# Patient Record
Sex: Female | Born: 1935 | Race: Black or African American | Hispanic: No | Smoking: Never smoker
Health system: Southern US, Community
[De-identification: ages and names within clinical notes are randomized; demographics above are authoritative.]

## PROBLEM LIST (undated history)

## (undated) DIAGNOSIS — R609 Edema, unspecified: Secondary | ICD-10-CM

## (undated) DIAGNOSIS — B029 Zoster without complications: Secondary | ICD-10-CM

## (undated) DIAGNOSIS — N189 Chronic kidney disease, unspecified: Secondary | ICD-10-CM

## (undated) DIAGNOSIS — C50912 Malignant neoplasm of unspecified site of left female breast: Secondary | ICD-10-CM

## (undated) DIAGNOSIS — R1310 Dysphagia, unspecified: Secondary | ICD-10-CM

## (undated) DIAGNOSIS — K2950 Unspecified chronic gastritis without bleeding: Secondary | ICD-10-CM

## (undated) DIAGNOSIS — R109 Unspecified abdominal pain: Secondary | ICD-10-CM

## (undated) DIAGNOSIS — J449 Chronic obstructive pulmonary disease, unspecified: Secondary | ICD-10-CM

## (undated) DIAGNOSIS — R198 Other specified symptoms and signs involving the digestive system and abdomen: Secondary | ICD-10-CM

## (undated) DIAGNOSIS — C50919 Malignant neoplasm of unspecified site of unspecified female breast: Secondary | ICD-10-CM

## (undated) DIAGNOSIS — N3281 Overactive bladder: Secondary | ICD-10-CM

## (undated) DIAGNOSIS — I10 Essential (primary) hypertension: Secondary | ICD-10-CM

## (undated) HISTORY — PX: VAGINAL HYSTERECTOMY: SUR661

## (undated) HISTORY — DX: Malignant neoplasm of unspecified site of left female breast: C50.912

## (undated) HISTORY — DX: Overactive bladder: N32.81

## (undated) HISTORY — DX: Other specified symptoms and signs involving the digestive system and abdomen: R19.8

## (undated) HISTORY — DX: Chronic kidney disease, unspecified: N18.9

## (undated) HISTORY — DX: Dysphagia, unspecified: R13.10

## (undated) HISTORY — DX: Unspecified abdominal pain: R10.9

## (undated) HISTORY — DX: Edema, unspecified: R60.9

## (undated) HISTORY — DX: Essential (primary) hypertension: I10

## (undated) HISTORY — DX: Unspecified chronic gastritis without bleeding: K29.50

## (undated) HISTORY — DX: Zoster without complications: B02.9

---

## 1998-02-22 ENCOUNTER — Ambulatory Visit (HOSPITAL_COMMUNITY): Admission: RE | Admit: 1998-02-22 | Discharge: 1998-02-22 | Payer: Self-pay | Admitting: *Deleted

## 1998-06-30 ENCOUNTER — Ambulatory Visit (HOSPITAL_COMMUNITY): Admission: RE | Admit: 1998-06-30 | Discharge: 1998-06-30 | Payer: Self-pay | Admitting: General Surgery

## 1998-07-13 ENCOUNTER — Ambulatory Visit (HOSPITAL_COMMUNITY): Admission: RE | Admit: 1998-07-13 | Discharge: 1998-07-13 | Payer: Self-pay | Admitting: General Surgery

## 1998-08-15 ENCOUNTER — Encounter: Admission: RE | Admit: 1998-08-15 | Discharge: 1998-11-13 | Payer: Self-pay | Admitting: Radiation Oncology

## 1999-01-03 ENCOUNTER — Encounter: Payer: Self-pay | Admitting: *Deleted

## 1999-01-03 ENCOUNTER — Ambulatory Visit (HOSPITAL_COMMUNITY): Admission: RE | Admit: 1999-01-03 | Discharge: 1999-01-03 | Payer: Self-pay | Admitting: *Deleted

## 1999-01-16 ENCOUNTER — Encounter: Payer: Self-pay | Admitting: Hematology and Oncology

## 1999-01-16 ENCOUNTER — Ambulatory Visit (HOSPITAL_COMMUNITY): Admission: RE | Admit: 1999-01-16 | Discharge: 1999-01-16 | Payer: Self-pay | Admitting: Hematology and Oncology

## 1999-02-06 ENCOUNTER — Ambulatory Visit (HOSPITAL_COMMUNITY): Admission: RE | Admit: 1999-02-06 | Discharge: 1999-02-06 | Payer: Self-pay | Admitting: General Surgery

## 1999-02-06 ENCOUNTER — Encounter (HOSPITAL_BASED_OUTPATIENT_CLINIC_OR_DEPARTMENT_OTHER): Payer: Self-pay | Admitting: General Surgery

## 1999-02-14 ENCOUNTER — Ambulatory Visit (HOSPITAL_COMMUNITY): Admission: RE | Admit: 1999-02-14 | Discharge: 1999-02-14 | Payer: Self-pay | Admitting: General Surgery

## 1999-02-14 ENCOUNTER — Encounter (HOSPITAL_BASED_OUTPATIENT_CLINIC_OR_DEPARTMENT_OTHER): Payer: Self-pay | Admitting: General Surgery

## 1999-08-14 ENCOUNTER — Ambulatory Visit (HOSPITAL_COMMUNITY): Admission: RE | Admit: 1999-08-14 | Discharge: 1999-08-14 | Payer: Self-pay | Admitting: Hematology and Oncology

## 1999-08-14 ENCOUNTER — Encounter: Payer: Self-pay | Admitting: Hematology and Oncology

## 1999-09-28 ENCOUNTER — Inpatient Hospital Stay (HOSPITAL_COMMUNITY): Admission: EM | Admit: 1999-09-28 | Discharge: 1999-10-05 | Payer: Self-pay | Admitting: Critical Care Medicine

## 1999-09-29 ENCOUNTER — Encounter: Payer: Self-pay | Admitting: Critical Care Medicine

## 1999-10-02 ENCOUNTER — Encounter: Payer: Self-pay | Admitting: Critical Care Medicine

## 2000-02-14 ENCOUNTER — Ambulatory Visit (HOSPITAL_COMMUNITY): Admission: RE | Admit: 2000-02-14 | Discharge: 2000-02-14 | Payer: Self-pay | Admitting: *Deleted

## 2000-02-14 ENCOUNTER — Encounter: Payer: Self-pay | Admitting: *Deleted

## 2000-03-26 ENCOUNTER — Encounter: Payer: Self-pay | Admitting: Emergency Medicine

## 2000-03-27 ENCOUNTER — Inpatient Hospital Stay (HOSPITAL_COMMUNITY): Admission: EM | Admit: 2000-03-27 | Discharge: 2000-04-01 | Payer: Self-pay | Admitting: Emergency Medicine

## 2000-08-19 ENCOUNTER — Encounter: Payer: Self-pay | Admitting: *Deleted

## 2000-08-19 ENCOUNTER — Encounter: Admission: RE | Admit: 2000-08-19 | Discharge: 2000-08-19 | Payer: Self-pay | Admitting: *Deleted

## 2001-06-26 ENCOUNTER — Encounter: Payer: Self-pay | Admitting: *Deleted

## 2001-06-26 ENCOUNTER — Ambulatory Visit (HOSPITAL_COMMUNITY): Admission: RE | Admit: 2001-06-26 | Discharge: 2001-06-26 | Payer: Self-pay | Admitting: *Deleted

## 2001-08-07 ENCOUNTER — Inpatient Hospital Stay (HOSPITAL_COMMUNITY): Admission: EM | Admit: 2001-08-07 | Discharge: 2001-08-10 | Payer: Self-pay | Admitting: Internal Medicine

## 2001-08-13 ENCOUNTER — Ambulatory Visit (HOSPITAL_COMMUNITY): Admission: RE | Admit: 2001-08-13 | Discharge: 2001-08-13 | Payer: Self-pay | Admitting: *Deleted

## 2001-09-02 ENCOUNTER — Encounter (HOSPITAL_COMMUNITY): Payer: Self-pay | Admitting: Oncology

## 2001-09-02 ENCOUNTER — Encounter: Payer: Self-pay | Admitting: Internal Medicine

## 2001-09-02 ENCOUNTER — Ambulatory Visit (HOSPITAL_COMMUNITY): Admission: RE | Admit: 2001-09-02 | Discharge: 2001-09-02 | Payer: Self-pay | Admitting: Oncology

## 2001-09-02 ENCOUNTER — Encounter: Admission: RE | Admit: 2001-09-02 | Discharge: 2001-09-02 | Payer: Self-pay | Admitting: Family Medicine

## 2002-09-06 ENCOUNTER — Encounter: Admission: RE | Admit: 2002-09-06 | Discharge: 2002-09-06 | Payer: Self-pay | Admitting: Internal Medicine

## 2002-09-06 ENCOUNTER — Encounter: Payer: Self-pay | Admitting: Internal Medicine

## 2002-09-28 ENCOUNTER — Ambulatory Visit (HOSPITAL_COMMUNITY): Admission: RE | Admit: 2002-09-28 | Discharge: 2002-09-28 | Payer: Self-pay | Admitting: Oncology

## 2002-09-28 ENCOUNTER — Encounter (HOSPITAL_COMMUNITY): Payer: Self-pay | Admitting: Oncology

## 2002-11-07 ENCOUNTER — Emergency Department (HOSPITAL_COMMUNITY): Admission: EM | Admit: 2002-11-07 | Discharge: 2002-11-08 | Payer: Self-pay | Admitting: Emergency Medicine

## 2003-09-08 ENCOUNTER — Encounter: Admission: RE | Admit: 2003-09-08 | Discharge: 2003-09-08 | Payer: Self-pay | Admitting: Internal Medicine

## 2003-09-08 ENCOUNTER — Encounter: Payer: Self-pay | Admitting: Internal Medicine

## 2003-09-16 ENCOUNTER — Ambulatory Visit: Admission: RE | Admit: 2003-09-16 | Discharge: 2003-09-16 | Payer: Self-pay | Admitting: *Deleted

## 2003-09-16 ENCOUNTER — Ambulatory Visit (HOSPITAL_COMMUNITY): Admission: RE | Admit: 2003-09-16 | Discharge: 2003-09-16 | Payer: Self-pay | Admitting: *Deleted

## 2003-09-16 ENCOUNTER — Encounter: Payer: Self-pay | Admitting: Internal Medicine

## 2003-09-30 ENCOUNTER — Encounter (HOSPITAL_COMMUNITY): Payer: Self-pay | Admitting: Oncology

## 2003-09-30 ENCOUNTER — Ambulatory Visit (HOSPITAL_COMMUNITY): Admission: RE | Admit: 2003-09-30 | Discharge: 2003-09-30 | Payer: Self-pay | Admitting: Oncology

## 2004-09-11 ENCOUNTER — Ambulatory Visit (HOSPITAL_COMMUNITY): Admission: RE | Admit: 2004-09-11 | Discharge: 2004-09-11 | Payer: Self-pay | Admitting: Gastroenterology

## 2004-09-13 ENCOUNTER — Encounter: Admission: RE | Admit: 2004-09-13 | Discharge: 2004-09-13 | Payer: Self-pay | Admitting: General Surgery

## 2004-09-20 ENCOUNTER — Inpatient Hospital Stay (HOSPITAL_COMMUNITY): Admission: AD | Admit: 2004-09-20 | Discharge: 2004-09-21 | Payer: Self-pay | Admitting: Internal Medicine

## 2004-11-06 ENCOUNTER — Ambulatory Visit: Payer: Self-pay | Admitting: Oncology

## 2004-11-13 ENCOUNTER — Ambulatory Visit: Payer: Self-pay | Admitting: Internal Medicine

## 2005-01-08 ENCOUNTER — Ambulatory Visit: Payer: Self-pay | Admitting: Internal Medicine

## 2005-04-19 ENCOUNTER — Ambulatory Visit: Payer: Self-pay | Admitting: Internal Medicine

## 2005-09-16 ENCOUNTER — Encounter: Admission: RE | Admit: 2005-09-16 | Discharge: 2005-09-16 | Payer: Self-pay | Admitting: Internal Medicine

## 2005-10-03 ENCOUNTER — Ambulatory Visit: Payer: Self-pay | Admitting: Oncology

## 2005-11-04 ENCOUNTER — Ambulatory Visit (HOSPITAL_COMMUNITY): Admission: RE | Admit: 2005-11-04 | Discharge: 2005-11-04 | Payer: Self-pay | Admitting: Oncology

## 2006-08-22 ENCOUNTER — Encounter: Admission: RE | Admit: 2006-08-22 | Discharge: 2006-08-22 | Payer: Self-pay | Admitting: Oncology

## 2006-10-29 ENCOUNTER — Ambulatory Visit: Payer: Self-pay | Admitting: Oncology

## 2006-11-03 LAB — CBC WITH DIFFERENTIAL/PLATELET
BASO%: 0.6 % (ref 0.0–2.0)
EOS%: 6.6 % (ref 0.0–7.0)
HGB: 12.3 g/dL (ref 11.6–15.9)
MCH: 31.6 pg (ref 26.0–34.0)
MCHC: 34.1 g/dL (ref 32.0–36.0)
RDW: 12.3 % (ref 11.3–14.5)
lymph#: 1.7 10*3/uL (ref 0.9–3.3)

## 2006-11-03 LAB — LACTATE DEHYDROGENASE: LDH: 158 U/L (ref 94–250)

## 2006-11-03 LAB — COMPREHENSIVE METABOLIC PANEL
AST: 22 U/L (ref 0–37)
Albumin: 3.8 g/dL (ref 3.5–5.2)
Alkaline Phosphatase: 70 U/L (ref 39–117)
Potassium: 3.9 mEq/L (ref 3.5–5.3)
Sodium: 140 mEq/L (ref 135–145)
Total Protein: 6.8 g/dL (ref 6.0–8.3)

## 2006-12-03 ENCOUNTER — Emergency Department (HOSPITAL_COMMUNITY): Admission: EM | Admit: 2006-12-03 | Discharge: 2006-12-03 | Payer: Self-pay | Admitting: Emergency Medicine

## 2006-12-03 ENCOUNTER — Encounter: Payer: Self-pay | Admitting: Vascular Surgery

## 2007-01-31 ENCOUNTER — Inpatient Hospital Stay (HOSPITAL_COMMUNITY): Admission: EM | Admit: 2007-01-31 | Discharge: 2007-02-01 | Payer: Self-pay | Admitting: Emergency Medicine

## 2007-08-07 ENCOUNTER — Ambulatory Visit: Payer: Self-pay | Admitting: Internal Medicine

## 2007-08-07 LAB — CONVERTED CEMR LAB
BUN: 17 mg/dL (ref 6–23)
Basophils Absolute: 0 10*3/uL (ref 0.0–0.1)
Basophils Relative: 0.4 % (ref 0.0–1.0)
CO2: 27 meq/L (ref 19–32)
Calcium: 9.6 mg/dL (ref 8.4–10.5)
Chloride: 102 meq/L (ref 96–112)
Creatinine, Ser: 1.5 mg/dL — ABNORMAL HIGH (ref 0.4–1.2)
Eosinophils Absolute: 0.7 10*3/uL — ABNORMAL HIGH (ref 0.0–0.6)
Eosinophils Relative: 10.1 % — ABNORMAL HIGH (ref 0.0–5.0)
GFR calc Af Amer: 42 mL/min
GFR calc non Af Amer: 35 mL/min
Glucose, Bld: 89 mg/dL (ref 70–99)
HCT: 32.8 % — ABNORMAL LOW (ref 36.0–46.0)
Hemoglobin: 11.3 g/dL — ABNORMAL LOW (ref 12.0–15.0)
Lymphocytes Relative: 28.3 % (ref 12.0–46.0)
MCHC: 34.5 g/dL (ref 30.0–36.0)
MCV: 92.8 fL (ref 78.0–100.0)
Monocytes Absolute: 0.5 10*3/uL (ref 0.2–0.7)
Monocytes Relative: 7.9 % (ref 3.0–11.0)
Neutro Abs: 3.5 10*3/uL (ref 1.4–7.7)
Neutrophils Relative %: 53.3 % (ref 43.0–77.0)
Platelets: 289 10*3/uL (ref 150–400)
Potassium: 3.4 meq/L — ABNORMAL LOW (ref 3.5–5.1)
Pro B Natriuretic peptide (BNP): 32 pg/mL (ref 0.0–100.0)
RBC: 3.54 M/uL — ABNORMAL LOW (ref 3.87–5.11)
RDW: 11.4 % — ABNORMAL LOW (ref 11.5–14.6)
Sodium: 139 meq/L (ref 135–145)
WBC: 6.5 10*3/uL (ref 4.5–10.5)

## 2007-08-14 ENCOUNTER — Ambulatory Visit: Payer: Self-pay | Admitting: Internal Medicine

## 2007-08-25 ENCOUNTER — Encounter: Admission: RE | Admit: 2007-08-25 | Discharge: 2007-08-25 | Payer: Self-pay | Admitting: Oncology

## 2007-10-28 ENCOUNTER — Ambulatory Visit: Payer: Self-pay | Admitting: Internal Medicine

## 2007-10-28 ENCOUNTER — Ambulatory Visit: Payer: Self-pay | Admitting: Oncology

## 2007-11-02 LAB — CBC WITH DIFFERENTIAL/PLATELET
Basophils Absolute: 0 10*3/uL (ref 0.0–0.1)
EOS%: 3.5 % (ref 0.0–7.0)
LYMPH%: 26.8 % (ref 14.0–48.0)
MCH: 32.2 pg (ref 26.0–34.0)
MCV: 92.2 fL (ref 81.0–101.0)
MONO%: 7.6 % (ref 0.0–13.0)
RBC: 3.68 10*6/uL — ABNORMAL LOW (ref 3.70–5.32)
RDW: 12.5 % (ref 11.3–14.5)

## 2007-11-02 LAB — COMPREHENSIVE METABOLIC PANEL
AST: 23 U/L (ref 0–37)
Albumin: 3.9 g/dL (ref 3.5–5.2)
Alkaline Phosphatase: 54 U/L (ref 39–117)
BUN: 21 mg/dL (ref 6–23)
Potassium: 3.8 mEq/L (ref 3.5–5.3)
Sodium: 143 mEq/L (ref 135–145)
Total Bilirubin: 0.6 mg/dL (ref 0.3–1.2)

## 2008-07-01 ENCOUNTER — Ambulatory Visit: Payer: Self-pay | Admitting: Internal Medicine

## 2008-07-01 DIAGNOSIS — C50919 Malignant neoplasm of unspecified site of unspecified female breast: Secondary | ICD-10-CM | POA: Insufficient documentation

## 2008-07-01 DIAGNOSIS — J45909 Unspecified asthma, uncomplicated: Secondary | ICD-10-CM | POA: Insufficient documentation

## 2008-07-01 DIAGNOSIS — I10 Essential (primary) hypertension: Secondary | ICD-10-CM | POA: Insufficient documentation

## 2008-09-08 ENCOUNTER — Encounter: Admission: RE | Admit: 2008-09-08 | Discharge: 2008-09-08 | Payer: Self-pay | Admitting: Internal Medicine

## 2008-10-28 ENCOUNTER — Ambulatory Visit: Payer: Self-pay | Admitting: Oncology

## 2008-11-01 LAB — CBC WITH DIFFERENTIAL/PLATELET
BASO%: 0.4 % (ref 0.0–2.0)
Basophils Absolute: 0 10*3/uL (ref 0.0–0.1)
HCT: 36 % (ref 34.8–46.6)
HGB: 12.4 g/dL (ref 11.6–15.9)
MCHC: 34.5 g/dL (ref 32.0–36.0)
MONO#: 0.7 10*3/uL (ref 0.1–0.9)
NEUT#: 8.3 10*3/uL — ABNORMAL HIGH (ref 1.5–6.5)
NEUT%: 76.4 % (ref 39.6–76.8)
WBC: 10.9 10*3/uL — ABNORMAL HIGH (ref 3.9–10.0)
lymph#: 1.8 10*3/uL (ref 0.9–3.3)

## 2008-11-01 LAB — COMPREHENSIVE METABOLIC PANEL
ALT: 19 U/L (ref 0–35)
CO2: 27 mEq/L (ref 19–32)
Calcium: 9.1 mg/dL (ref 8.4–10.5)
Chloride: 104 mEq/L (ref 96–112)
Creatinine, Ser: 1.39 mg/dL — ABNORMAL HIGH (ref 0.40–1.20)

## 2008-11-01 LAB — LACTATE DEHYDROGENASE: LDH: 180 U/L (ref 94–250)

## 2009-10-10 ENCOUNTER — Encounter: Admission: RE | Admit: 2009-10-10 | Discharge: 2009-10-10 | Payer: Self-pay | Admitting: Internal Medicine

## 2009-10-17 ENCOUNTER — Encounter: Admission: RE | Admit: 2009-10-17 | Discharge: 2009-10-17 | Payer: Self-pay | Admitting: Internal Medicine

## 2009-10-27 ENCOUNTER — Ambulatory Visit: Payer: Self-pay | Admitting: Oncology

## 2009-10-31 LAB — CBC WITH DIFFERENTIAL/PLATELET
Eosinophils Absolute: 0.4 10*3/uL (ref 0.0–0.5)
HCT: 33.9 % — ABNORMAL LOW (ref 34.8–46.6)
LYMPH%: 22.2 % (ref 14.0–49.7)
MCV: 94.2 fL (ref 79.5–101.0)
MONO#: 0.5 10*3/uL (ref 0.1–0.9)
NEUT#: 4.6 10*3/uL (ref 1.5–6.5)
NEUT%: 64 % (ref 38.4–76.8)
Platelets: 301 10*3/uL (ref 145–400)
WBC: 7.1 10*3/uL (ref 3.9–10.3)

## 2009-10-31 LAB — COMPREHENSIVE METABOLIC PANEL
BUN: 17 mg/dL (ref 6–23)
CO2: 24 mEq/L (ref 19–32)
Creatinine, Ser: 1.51 mg/dL — ABNORMAL HIGH (ref 0.40–1.20)
Glucose, Bld: 106 mg/dL — ABNORMAL HIGH (ref 70–99)
Total Bilirubin: 0.5 mg/dL (ref 0.3–1.2)

## 2009-10-31 LAB — LACTATE DEHYDROGENASE: LDH: 145 U/L (ref 94–250)

## 2010-02-06 ENCOUNTER — Ambulatory Visit: Payer: Self-pay | Admitting: Internal Medicine

## 2010-02-06 DIAGNOSIS — R0989 Other specified symptoms and signs involving the circulatory and respiratory systems: Secondary | ICD-10-CM | POA: Insufficient documentation

## 2010-02-06 DIAGNOSIS — R0609 Other forms of dyspnea: Secondary | ICD-10-CM

## 2010-02-06 LAB — CONVERTED CEMR LAB
ALT: 21 units/L (ref 0–35)
AST: 32 units/L (ref 0–37)
Albumin: 3.9 g/dL (ref 3.5–5.2)
Alkaline Phosphatase: 66 units/L (ref 39–117)
BUN: 23 mg/dL (ref 6–23)
Basophils Absolute: 0 10*3/uL (ref 0.0–0.1)
Basophils Relative: 0.4 % (ref 0.0–3.0)
Bilirubin, Direct: 0.1 mg/dL (ref 0.0–0.3)
CO2: 33 meq/L — ABNORMAL HIGH (ref 19–32)
Calcium: 9.8 mg/dL (ref 8.4–10.5)
Chloride: 103 meq/L (ref 96–112)
Creatinine, Ser: 1.5 mg/dL — ABNORMAL HIGH (ref 0.4–1.2)
Eosinophils Absolute: 0.2 10*3/uL (ref 0.0–0.7)
Eosinophils Relative: 2.8 % (ref 0.0–5.0)
GFR calc non Af Amer: 41.96 mL/min (ref >60)
Glucose, Bld: 96 mg/dL (ref 70–99)
HCT: 35.4 % — ABNORMAL LOW (ref 36.0–46.0)
Hemoglobin: 11.7 g/dL — ABNORMAL LOW (ref 12.0–15.0)
Lymphocytes Relative: 17.3 % (ref 12.0–46.0)
Lymphs Abs: 1.2 10*3/uL (ref 0.7–4.0)
MCHC: 33.2 g/dL (ref 30.0–36.0)
MCV: 96 fL (ref 78.0–100.0)
Monocytes Absolute: 0.2 10*3/uL (ref 0.1–1.0)
Monocytes Relative: 3.3 % (ref 3.0–12.0)
Neutro Abs: 5.4 10*3/uL (ref 1.4–7.7)
Neutrophils Relative %: 76.2 % (ref 43.0–77.0)
Platelets: 292 10*3/uL (ref 150.0–400.0)
Potassium: 3.8 meq/L (ref 3.5–5.1)
Pro B Natriuretic peptide (BNP): 50 pg/mL (ref 0.0–100.0)
RBC: 3.68 M/uL — ABNORMAL LOW (ref 3.87–5.11)
RDW: 12.5 % (ref 11.5–14.6)
Sodium: 145 meq/L (ref 135–145)
TSH: 4.76 microintl units/mL (ref 0.35–5.50)
Total Bilirubin: 0.6 mg/dL (ref 0.3–1.2)
Total Protein: 7.7 g/dL (ref 6.0–8.3)
WBC: 7 10*3/uL (ref 4.5–10.5)

## 2010-02-15 ENCOUNTER — Ambulatory Visit: Payer: Self-pay | Admitting: Internal Medicine

## 2010-04-16 ENCOUNTER — Ambulatory Visit: Payer: Self-pay | Admitting: Internal Medicine

## 2010-11-15 ENCOUNTER — Ambulatory Visit: Payer: Self-pay | Admitting: Oncology

## 2010-11-19 ENCOUNTER — Encounter: Admission: RE | Admit: 2010-11-19 | Discharge: 2010-11-19 | Payer: Self-pay | Admitting: Internal Medicine

## 2010-11-21 ENCOUNTER — Encounter: Payer: Self-pay | Admitting: Internal Medicine

## 2010-11-21 LAB — CBC WITH DIFFERENTIAL/PLATELET
BASO%: 0.5 % (ref 0.0–2.0)
Basophils Absolute: 0 10*3/uL (ref 0.0–0.1)
EOS%: 8.5 % — ABNORMAL HIGH (ref 0.0–7.0)
Eosinophils Absolute: 0.6 10*3/uL — ABNORMAL HIGH (ref 0.0–0.5)
HCT: 32.1 % — ABNORMAL LOW (ref 34.8–46.6)
HGB: 11 g/dL — ABNORMAL LOW (ref 11.6–15.9)
LYMPH%: 37.1 % (ref 14.0–49.7)
MCH: 32 pg (ref 25.1–34.0)
MCHC: 34.3 g/dL (ref 31.5–36.0)
MCV: 93.6 fL (ref 79.5–101.0)
MONO#: 0.6 10*3/uL (ref 0.1–0.9)
MONO%: 8.6 % (ref 0.0–14.0)
NEUT#: 3.2 10*3/uL (ref 1.5–6.5)
NEUT%: 45.3 % (ref 38.4–76.8)
Platelets: 299 10*3/uL (ref 145–400)
RBC: 3.43 10*6/uL — ABNORMAL LOW (ref 3.70–5.45)
RDW: 12.9 % (ref 11.2–14.5)
WBC: 7.1 10*3/uL (ref 3.9–10.3)
lymph#: 2.6 10*3/uL (ref 0.9–3.3)

## 2010-11-22 LAB — COMPREHENSIVE METABOLIC PANEL
ALT: 9 U/L (ref 0–35)
AST: 19 U/L (ref 0–37)
Albumin: 4 g/dL (ref 3.5–5.2)
Alkaline Phosphatase: 70 U/L (ref 39–117)
BUN: 26 mg/dL — ABNORMAL HIGH (ref 6–23)
CO2: 27 mEq/L (ref 19–32)
Calcium: 9.5 mg/dL (ref 8.4–10.5)
Chloride: 107 mEq/L (ref 96–112)
Creatinine, Ser: 1.51 mg/dL — ABNORMAL HIGH (ref 0.40–1.20)
Glucose, Bld: 87 mg/dL (ref 70–99)
Potassium: 3.4 mEq/L — ABNORMAL LOW (ref 3.5–5.3)
Sodium: 144 mEq/L (ref 135–145)
Total Bilirubin: 0.5 mg/dL (ref 0.3–1.2)
Total Protein: 6.9 g/dL (ref 6.0–8.3)

## 2010-11-22 LAB — PREALBUMIN: Prealbumin: 19.4 mg/dL (ref 18.0–45.0)

## 2010-11-22 LAB — LACTATE DEHYDROGENASE: LDH: 163 U/L (ref 94–250)

## 2011-01-05 ENCOUNTER — Encounter (HOSPITAL_COMMUNITY): Payer: Self-pay | Admitting: Oncology

## 2011-01-06 ENCOUNTER — Encounter: Payer: Self-pay | Admitting: Internal Medicine

## 2011-01-17 NOTE — Letter (Signed)
Summary: [facility]  [facility]   Imported By: Lester Comfrey 11/28/2010 08:19:01  _____________________________________________________________________  External Attachment:    Type:   Image     Comment:   External Document

## 2011-01-17 NOTE — Assessment & Plan Note (Signed)
Summary: Pulmonary/ eval sob and teach hfa 50%   Primary Provider/Referring Provider:  Kirby Funk  CC:  Acute visit.  Pt c/o increased SOB that "comes and goes".  No other complaints today.Marland Kitchen  History of Present Illness: 33  yobf with chronic asthma prednisone dependent with floor of 5 mg every other day   February 06, 2010 Acute visit.  Pt c/o increased SOB that "comes and goes".  No other complaints of cough,  no noct or early am exac.  Tried ventolin > makes throat too dry.  Pt denies any significant sore throat, dysphagia, itching, sneezing,  nasal congestion or excess secretions,  fever, chills, sweats, unintended wt loss, pleuritic or exertional cp, hempoptysis, change in activity tolerance  orthopnea pnd or leg swelling.  Pt also denies any obvious fluctuation in symptoms with weather or environmental change or other alleviating or aggravating factors.       Current Medications (verified): 1)  Omeprazole 20 Mg  Cpdr (Omeprazole) .... Once Daily 2)  Oxybutynin Chloride 5 Mg  Xr24h-Tab (Oxybutynin Chloride) .... 1/2 Three Times A Day 3)  Triamterene-Hctz 37.5-25 Mg  Tabs (Triamterene-Hctz) .... Once Daily 4)  Klor-Con 20 Meq  Pack (Potassium Chloride) .... Two Times A Day 5)  Advair Diskus 100-50 Mcg/dose  Misc (Fluticasone-Salmeterol) .Marland Kitchen.. 1 Puff Two Times A Day 6)  Felodipine 5 Mg  Xr24h-Tab (Felodipine) .... Once Daily 7)  Ventolin Hfa 108 (90 Base) Mcg/act  Aers (Albuterol Sulfate) .... 2 Puffs Every 4 Hours As Needed 8)  Megace Oral 40 Mg/ml  Susp (Megestrol Acetate) .... As Needed 9)  Prednisone 10 Mg  Tabs (Prednisone) .... 1/2 Every Other Day  Allergies (verified): No Known Drug Allergies  Past History:  Past Medical History: Asthma    - HFA  50 % February 06, 2010  Hypertension  Vital Signs:  Patient profile:   77 year old female Weight:      123.50 pounds O2 Sat:      94 % on Room air Temp:     97.9 degrees F oral Pulse rate:   104 / minute BP sitting:   130  / 60  (left arm)  Vitals Entered By: Vernie Murders (February 06, 2010 10:22 AM)  O2 Flow:  Room air  Physical Exam  Additional Exam:  amb bf nad wt 128 > 123 February 06, 2010  HEENT mild turbinate edema.  Oropharynx no thrush or excess pnd or cobblestoning.  No JVD or cervical adenopathy. Mild accessory muscle hypertrophy. Trachea midline, nl thryroid. Chest was hyperinflated by percussion with diminished breath sounds and moderate increased exp time with trace bilater exp wheeze.  Hoover sign positive at mid inspiration. Regular rate and rhythm without murmur gallop or rub or increase P2.  No edema. Abd: no hsm, nl excursion. Ext warm without cyanosis or clubbing.    Sodium                    145 mEq/L                   135-145   Potassium                 3.8 mEq/L                   3.5-5.1   Chloride                  103 mEq/L  96-112   Carbon Dioxide       [H]  33 mEq/L                    19-32   Glucose                   96 mg/dL                    16-10   BUN                       23 mg/dL                    9-60   Creatinine           [H]  1.5 mg/dL                   4.5-4.0   Calcium                   9.8 mg/dL                   9.8-11.9   GFR                       41.96 mL/min                >60  Tests: (2) TSH (TSH)   FastTSH                   4.76 uIU/mL                 0.35-5.50  Tests: (3) CBC Platelet w/Diff (CBCD)   White Cell Count          7.0 K/uL                    4.5-10.5   Red Cell Count       [L]  3.68 Mil/uL                 3.87-5.11   Hemoglobin           [L]  11.7 g/dL                   14.7-82.9   Hematocrit           [L]  35.4 %                      36.0-46.0   MCV                       96.0 fl                     78.0-100.0   MCHC                      33.2 g/dL                   56.2-13.0   RDW                       12.5 %                      11.5-14.6   Platelet Count            292.0 K/uL  150.0-400.0   Neutrophil %               76.2 %                      43.0-77.0   Lymphocyte %              17.3 %                      12.0-46.0   Monocyte %                3.3 %                       3.0-12.0   Eosinophils%              2.8 %                       0.0-5.0   Basophils %               0.4 %                       0.0-3.0   Neutrophill Absolute      5.4 K/uL                    1.4-7.7   Lymphocyte Absolute       1.2 K/uL                    0.7-4.0   Monocyte Absolute         0.2 K/uL                    0.1-1.0  Eosinophils, Absolute                             0.2 K/uL                    0.0-0.7   Basophils Absolute        0.0 K/uL                    0.0-0.1  Tests: (4) B-Type Natiuretic Peptide (BNPR)  B-Type Natriuetic Peptide                             50.0 pg/mL                  0.0-100.0  Tests: (5) Hepatic/Liver Function Panel (HEPATIC)   Total Bilirubin           0.6 mg/dL                   1.6-1.0   Direct Bilirubin          0.1 mg/dL                   9.6-0.4   Alkaline Phosphatase      66 U/L                      39-117   AST                       32 U/L  0-37   ALT                       21 U/L                      0-35   Total Protein             7.7 g/dL                    7.8-4.6   Albumin                   3.9 g/dL                    9.6-2.9  CXR  Procedure date:  02/06/2010  Findings:        Comparison: 01/31/2005.   Findings: The heart size and mediastinal contours are normal.  The the lungs are clear and do not appear significantly hyperinflated. There is no pleural effusion or pneumothorax.  There is an apparent mild superior endplate compression deformity of the lower thoracic spine on the lateral view, not seen on the prior examination.   IMPRESSION: No active cardiopulmonary process.  Mild superior endplate compression deformity of the lower thoracic spine.  Impression & Recommendations:  Problem # 1:  ASTHMA (ICD-493.90) DDX of  difficult  airways managment all start with A and  include Adherence, Ace Inhibitors, Acid Reflux, Active Sinus Disease, Alpha 1 Antitripsin deficiency, Anxiety masquerading as Airways dz,  ABPA,  allergy(esp in young), Aspiration (esp in elderly), Adverse effects of DPI,  Active smokers, plus one B  = Beta blocker use..    Adherence = poor hfa and doubt she's fully compliant with other meds on her "list" (dated 2009 without changes since!) Acid Reflux always a concern as well    I spent extra time with the patient today explaining optimal mdi  technique.  This improved from  25> 50%    Each maintenance medication was reviewed in detail including most importantly the difference between maintenance and as needed and under what circumstances the prns are to be used. Struggling with med reconciliation.   To keep things simple, I have asked the patient to first separate medicines that are perceived as maintenance, that is to be taken daily "no matter what", from those medicines that are taken on only on an as-needed basis and I have given the patient examples of both, and then return to see our NP to generate a  detailed  medication calendar which should be followed until the next physician sees the patient and updates it.   Once we're sure that we're all reading from the same page in terms of medication admiistration, she needs to be scheduled to follow up with me   Problem # 2:  HYPERTENSION (ICD-401.9) Mild renal insufficiency but no evidence of chf/cardiac asthma or met acidosis so not change in rx.  Her updated medication list for this problem includes:    Triamterene-hctz 37.5-25 Mg Tabs (Triamterene-hctz) ..... Once daily    Felodipine 5 Mg Xr24h-tab (Felodipine) ..... Once daily  Medications Added to Medication List This Visit: 1)  Prednisone 10 Mg Tabs (Prednisone) .... 1/2 every other day 2)  Prednisone 10 Mg Tabs (Prednisone) .... 2 daily until better, then one daily x 5days, then one half x 5 days,  then one half every other day 3)  Proair Hfa 108 (90 Base) Mcg/act Aers (Albuterol sulfate) .Marland KitchenMarland KitchenMarland Kitchen  1-2 puffs every 4-6 hours as needed  Other Orders: TLB-BMP (Basic Metabolic Panel-BMET) (80048-METABOL) TLB-TSH (Thyroid Stimulating Hormone) (84443-TSH) TLB-CBC Platelet - w/Differential (85025-CBCD) TLB-BNP (B-Natriuretic Peptide) (83880-BNPR) TLB-Hepatic/Liver Function Pnl (80076-HEPATIC) Est. Patient Level IV ([address]109) HFA Instruction (60454) T-2 View CXR (71020TC)  Patient Instructions: 1)  GERD (REFLUX)  is a common cause of respiratory symptoms. It commonly presents without heartburn and can be treated with medication, but also with lifestyle changes including avoidance of late meals, excessive alcohol, smoking cessation, and avoid fatty foods, chocolate, peppermint, colas, red wine, and acidic juices such as orange juice. NO MINT OR MENTHOL PRODUCTS SO NO COUGH DROPS  2)  USE SUGARLESS CANDY INSTEAD (jolley ranchers)  3)  NO OIL BASED VITAMINS  4)  Prednsione 10 mg 2 daily until better, then one daily x 5days, then one half x 5 days, then one half every other day 5)  Work on inhaler technique:  relax and blow all the way out then take a nice smooth deep breath back in, triggering the inhaler at same time you start breathing in and hold breath at least a few seconds 6)  See calendar for specific medication instructions and bring it back for each and every office visit for every healthcare provider you see.  Without it,  you may not receive the best quality medical care that we feel you deserve. Marland Kitchen 7)  See Tammy NP w/in 2 weeks with all your medications, even over the counter meds, separated in two separate bags, the ones you take no matter what vs the ones you stop once you feel better and take only as needed.  She will generate for you a new user friendly medication calendar that will put [place] all on the same page re: your medication use.  Prescriptions: PREDNISONE 10 MG  TABS (PREDNISONE) 2  daily until better, then one daily x 5days, then one half x 5 days, then one half every other day  #100 x 0   Entered and Authorized by:   Nyoka Cowden MD   Signed by:   Nyoka Cowden MD on 02/06/2010   Method used:   Electronically to        [facility] [phone]* (retail)       [address]       [place], [place]  [address]       Ph: [phone]       Fax: [phone]   RxID:   [id number]

## 2011-01-17 NOTE — Assessment & Plan Note (Signed)
Summary: Pulmonary/ ext summary ov with hfa/dpi teaching and med cal rev   Primary Provider/Referring Provider:  Kirby Funk  CC:  2 month followup.  Pt states that her breathing has been doing well.  She denies any complaints today.Marland Kitchen  History of Present Illness: 5  yobf ever smoker  with chronic asthma prednisone dependent with floor of 5 mg every other day   February 06, 2010 Acute visit.  Pt c/o increased SOB that "comes and goes".  No other complaints of cough,  no noct or early am exac.  Tried ventolin > makes throat too dry.     February 15, 2010 --Presents for a follow up and med review. Last visit w/ asthma flare- tx w/ steroid burst returns  feeling better -breathing returned to baseline.  rec follow calendar, taper pred to 10 mg one half every other day  Apr 16, 2010 2 month followup.  Pt states that her breathing has been doing well.  She denies any complaints today. Still struggling with meds despite calendar, couldn't answer the question "what would you do if your breathing got worse and you start wheezing" even though the instructions, which she read back to me, were incorporated at the bottom of the calendar she uses every day. Pt denies any significant sore throat, dysphagia, itching, sneezing,  nasal congestion or excess secretions,  fever, chills, sweats, unintended wt loss, pleuritic or exertional cp, hempoptysis, change in activity tolerance  orthopnea pnd or leg swelling Pt also denies any obvious fluctuation in symptoms with weather or environmental change or other alleviating or aggravating factors.       Current Medications (verified): 1)  Omeprazole 20 Mg  Cpdr (Omeprazole) .... Once Daily 2)  Oxybutynin Chloride 5 Mg  Xr24h-Tab (Oxybutynin Chloride) .... 1/2 Tab By Mouth Three Times A Day 3)  Triamterene-Hctz 37.5-25 Mg  Tabs (Triamterene-Hctz) .... Once Daily 4)  Klor-Con M20 20 Meq Cr-Tabs (Potassium Chloride Crys Cr) .... Take 1 Tablet By Mouth Two Times A Day 5)   Advair Diskus 100-50 Mcg/dose  Misc (Fluticasone-Salmeterol) .Marland Kitchen.. 1 Puff Two Times A Day 6)  Prednisone 10 Mg Tabs (Prednisone) .... 1/2 Tab By Mouth Every Other Day or See Bottom of Med Calendar For Tapering Directions 7)  Felodipine 5 Mg  Xr24h-Tab (Felodipine) .... Once Daily 8)  Proair Hfa 108 (90 Base) Mcg/act  Aers (Albuterol Sulfate) .... 2 Puffs Every 4-6 Hours As Needed For Wheezing 9)  Megace Oral 40 Mg/ml  Susp (Megestrol Acetate) .... 1/2 Two Times A Day As Needed For Hotflashes 10)  Prednisone 10 Mg  Tabs (Prednisone) .... 2 Daily Until Better, Then One Daily X 5days, Then One Half X 5 Days, Then One Half Every Other Day  Allergies (verified): No Known Drug Allergies  Past History:  Past Medical History: Asthma    - HFA  50 % February 06, 2010     - DPI  75% Apr 16, 2010  Hypertension Medical Non-adherence   -  computerized med calendar adjusted February 15, 2010, reinforced Apr 16, 2010 > Dorothy analogy  Vital Signs:  Patient profile:   77 year old female Weight:      125.13 pounds BMI:     25.36 O2 Sat:      95 % on Room air Temp:     98.3 degrees F oral Pulse rate:   112 / minute BP sitting:   134 / 78  (left arm)  Vitals Entered By:  Vernie Murders (Apr 16, 2010 9:37 AM)  O2 Flow:  Room air  Physical Exam  Additional Exam:  amb bf nad wt 128 > 123 February 06, 2010 >>123 February 15, 2010 > 125 Apr 16, 2010  HEENT mild turbinate edema.  Oropharynx no thrush or excess pnd or cobblestoning.  No JVD or cervical adenopathy. Mild accessory muscle hypertrophy. Trachea midline, nl thryroid. Chest was hyperinflated by percussion with diminished breath sounds and moderate increased exp time with trace bilater exp wheeze.  Hoover sign positive at mid inspiration. Regular rate and rhythm without murmur gallop or rub or increase P2.  No edema. Abd: no hsm, nl excursion. Ext warm without cyanosis or clubbing.     Impression & Recommendations:  Problem # 1:  ASTHMA  (ICD-493.90)  DDX of  difficult airways managment all start with A and  include Adherence, Ace Inhibitors, Acid Reflux, Active Sinus Disease, Alpha 1 Antitripsin deficiency, Anxiety masquerading as Airways dz,  ABPA,  allergy(esp in young), Aspiration (esp in elderly), Adverse effects of DPI,  Active smokers, plus one B  = Beta blocker use..    Adherence:  still struggling with maint vs prns.  I emphasized to the patient that just like Dorothy in [employer], she is already wearing "ruby slippers" she can click anytime she wants:  the answer to all her recurrent symptoms  is already  literally at her fingertips:   all she has to do is refer to the medicine calendar we provided her  and her problems  are each addressed in a user-friendly format, reading from left to right for each symptoms she's likely to develop between office visits.    Adherence:  I spent extra time with the patient today explaining optimal mdi  technique.  This improved from  25-50% and the use of DPI to 75%.   Each maintenance medication was reviewed in detail including most importantly the difference between maintenance and as needed and under what circumstances the prns are to be used. This was done in the context of a medication calendar review which provided the patient with a user-friendly unambiguous mechanism for medication administration and reconciliation and provides an action plan for all active problems. It is critical that this be shown to every doctor  for modification during the office visit if necessary so the patient can use it as a working document. See instructions for specific recommendations   Other Orders: Est. Patient Level IV (04540) HFA Instruction [phone])  Patient Instructions: 1)  See calendar for specific medication instructions and bring it back for each and every office visit for every healthcare provider you see.  Without it,  you may not receive the best quality medical care that we feel you  deserve. (remember Dorothy)

## 2011-01-17 NOTE — Assessment & Plan Note (Signed)
Summary: 2 wk med cal ///kp   Visit Type:  Follow-up Primary Provider/Referring Provider:  Kirby Funk  CC:  Pt here for medication cal.  History of Present Illness: 50  yobf with chronic asthma prednisone dependent with floor of 5 mg every other day   February 06, 2010 Acute visit.  Pt c/o increased SOB that "comes and goes".  No other complaints of cough,  no noct or early am exac.  Tried ventolin > makes throat too dry.     February 15, 2010 --Presents for a follow up and med review. Last visit w/ asthma flare- tx w/ steroid burst. She returns today , feeling better -breathing returned to baseline. She is on 20mg  of prednisone but is planning to start taper down to "floor " of 5mg  every other day. We reviewed her meds and updated her med calendar. She has stopped her prilosec-does not remember why. We discussed restarting this today. Denies chest pain, , orthopnea, hemoptysis, fever, n/v/d, edema, headache.     Allergies (verified): No Known Drug Allergies  Past History:  Social History: Last updated: 07/01/2008 never smoker retired Engineer, civil (consulting)  Risk Factors: Smoking Status: never (07/01/2008)  Past Medical History: Asthma    - HFA  50 % February 06, 2010  Hypertension  Meds reviewed with pt education and computerized med calendar adjiusted February 15, 2010  Review of Systems      See HPI  Vital Signs:  Patient profile:   77 year old female Height:      59 inches Weight:      123.13 pounds O2 Sat:      98 % on Room air Temp:     99.2 degrees F oral Pulse rate:   98 / minute BP sitting:   140 / 60  (left arm) Cuff size:   regular  Vitals Entered By: Zackery Barefoot CMA (February 15, 2010 10:18 AM)  O2 Flow:  Room air CC: Pt here for medication cal Comments Verified contact number and pharmacy with patient. Zackery Barefoot CMA  February 15, 2010 10:22 AM    Physical Exam  Additional Exam:  amb bf nad wt 128 > 123 February 06, 2010 >>123 February 15, 2010  HEENT mild  turbinate edema.  Oropharynx no thrush or excess pnd or cobblestoning.  No JVD or cervical adenopathy. Mild accessory muscle hypertrophy. Trachea midline, nl thryroid. Chest was hyperinflated by percussion with diminished breath sounds and moderate increased exp time with trace bilater exp wheeze.  Hoover sign positive at mid inspiration. Regular rate and rhythm without murmur gallop or rub or increase P2.  No edema. Abd: no hsm, nl excursion. Ext warm without cyanosis or clubbing.     Impression & Recommendations:  Problem # 1:  ASTHMA (ICD-493.90) Recent flare - now resolved w/ steroid burst Meds reviewed with pt education and computerized med calendar completed/adjusted.    follow up Dr. Sherene Sires in 2 months   Complete Medication List: 1)  Omeprazole 20 Mg Cpdr (Omeprazole) .... Once daily 2)  Oxybutynin Chloride 5 Mg Xr24h-tab (Oxybutynin chloride) .... 1/2 three times a day 3)  Triamterene-hctz 37.5-25 Mg Tabs (Triamterene-hctz) .... Once daily 4)  Klor-con 20 Meq Pack (Potassium chloride) .... Two times a day 5)  Advair Diskus 100-50 Mcg/dose Misc (Fluticasone-salmeterol) .Marland Kitchen.. 1 puff two times a day 6)  Felodipine 5 Mg Xr24h-tab (Felodipine) .... Once daily 7)  Megace Oral 40 Mg/ml Susp (Megestrol acetate) .... As needed 8)  Prednisone  10 Mg Tabs (Prednisone) .... 2 daily until better, then one daily x 5days, then one half x 5 days, then one half every other day 9)  Proair Hfa 108 (90 Base) Mcg/act Aers (Albuterol sulfate) .Marland Kitchen.. 1-2 puffs every 4-6 hours as needed  Other Orders: Est. Patient Level III (16109)  Patient Instructions: 1)  Restart Omeprazole 20mg  once daily before meal 2)  Follow med calendar closely and bring to each visit.  3)  Now that your breathing is back to normal , taper the prednisone as recommended on your med calnedar.  4)  follow up Dr. Sherene Sires 2-3 months  5)  Please contact office for sooner follow up if symptoms do not improve or worsen   Prescriptions: OMEPRAZOLE 20 MG  CPDR (OMEPRAZOLE) once daily  #30 x 5   Entered and Authorized by:   Rubye Oaks NP   Signed by:   Rubye Oaks NP on 02/15/2010   Method used:   Electronically to        [facility] [phone]* (retail)       [address]       [place], [place]  [address]       Ph: [phone]       Fax: [phone]   RxID:   [id number]    Immunization History:  Influenza Immunization History:    Influenza:  historical (09/18/2009)     Appended Document: med calendar update    Clinical Lists Changes  Medications: Changed medication from OXYBUTYNIN CHLORIDE 5 MG  XR24H-TAB (OXYBUTYNIN CHLORIDE) 1/2 three times a day to OXYBUTYNIN CHLORIDE 5 MG  XR24H-TAB (OXYBUTYNIN CHLORIDE) 1/2 tab by mouth three times a day Changed medication from KLOR-CON 20 MEQ  PACK (POTASSIUM CHLORIDE) two times a day to KLOR-CON M20 20 MEQ CR-TABS (POTASSIUM CHLORIDE CRYS CR) Take 1 tablet by mouth two times a day Added new medication of PREDNISONE 10 MG TABS (PREDNISONE) 1/2 tab by mouth every other day or see bottom of med calendar for tapering directions Changed medication from [facility] HFA 108 (90 BASE) MCG/ACT  AERS (ALBUTEROL SULFATE) 1-2 puffs every 4-6 hours as needed to PROAIR HFA 108 (90 BASE) MCG/ACT  AERS (ALBUTEROL SULFATE) 2 puffs every 4-6 hours as needed for wheezing Changed medication from MEGACE ORAL 40 MG/ML  SUSP (MEGESTROL ACETATE) as needed to MEGACE ORAL 40 MG/ML  SUSP (MEGESTROL ACETATE) 1/2 two times a day as needed for hotflashes

## 2011-02-19 ENCOUNTER — Encounter (HOSPITAL_BASED_OUTPATIENT_CLINIC_OR_DEPARTMENT_OTHER): Payer: Medicare Other | Admitting: Oncology

## 2011-02-19 DIAGNOSIS — Z853 Personal history of malignant neoplasm of breast: Secondary | ICD-10-CM

## 2011-03-04 ENCOUNTER — Encounter (HOSPITAL_BASED_OUTPATIENT_CLINIC_OR_DEPARTMENT_OTHER): Payer: Medicare Other | Admitting: Oncology

## 2011-03-04 DIAGNOSIS — Z853 Personal history of malignant neoplasm of breast: Secondary | ICD-10-CM

## 2011-04-30 NOTE — Assessment & Plan Note (Signed)
[facility]                             PULMONARY OFFICE NOTE   NAME:DAVISMarrie, Ebony Peterson                        MRN:          [id number]  DATE:08/14/2007                            DOB:          07-Aug-1918    HISTORY OF PRESENT ILLNESS:  The patient is an 77 year old African-  American patient of Dr. Thurston Hole who has a known history of asthma that  presents for a one week follow up. The patient was recently seen in the  office for an asthmatic flare. She was treated with a prednisone burst  and Zegerid was added into her daily regimen. The patient returns today  reporting that she is substantially improved. Her wheezing and shortness  of breath is totally resolved. The patient has brought her medications  in today and were reviewed. Upon evaluation, the patient had ran out of  her Advair for an unknown amount of time. The patient reports that it is  quite expensive, but she has recently switched to [facility] pharmacy which  seems to be cheaper prescriptions for her. The patient also had ran out  of her Prevacid prescription for several weeks. The patient denies any  purulent sputum, fever, chills, orthopnea, chest pain, PND, or leg  swelling.   PAST MEDICAL HISTORY:  Reviewed.   CURRENT MEDICATIONS:  Reviewed.   PHYSICAL EXAMINATION:  GENERAL:  The patient is a pleasant elderly  female in no acute distress.  VITAL SIGNS:  Afebrile with stable vital signs. O2 saturation is 97% on  room air.  HEENT:  Unremarkable.  NECK:  Supple without cervical adenopathy. No JVD.  LUNGS:  Clear to auscultation bilaterally without any wheezing or  crackles.  CARDIAC:  Regular rate.  ABDOMEN:  Soft and nontender.  EXTREMITIES:  Warm without any edema.   ASSESSMENT AND PLAN:  1. Recent asthmatic exacerbation. The patient is much improved with      steroid burst. The patient has recently run out of medications      including Advair and Prilosec. However, the patient was  given a      sample of Advair and was also given a prescription. The patient is      to call the office back if she is unable to afford Advair which we      could potentially switch her over to budesonide and Brovana, which      would be through her Medicare B plan. The patient was also given a      prescription to restart Prilosec daily for underlying reflux which      could be irritating the airways. The patient will return back with      Dr. Sherene Sires in 2 to 3 months, or sooner if needed.  2. Complex medication regimen. The patient's medications were reviewed      in detail. The      patient education was provided. A computerized medication calendar      was adjusted and reviewed with this patient.      Rubye Oaks, NP  Electronically Signed      Charlaine Dalton.  Sherene Sires, MD, [facility]  Electronically Signed   TP/MedQ  DD: 08/14/2007  DT: 08/15/2007  Job #: [id number]

## 2011-04-30 NOTE — Assessment & Plan Note (Signed)
[facility]                             PULMONARY OFFICE NOTE   NAME:DAVISAmamda, Ebony Peterson                        MRN:          [id number]  DATE:08/07/2007                            DOB:          12/21/1917    PULMONARY/EXTENDED FOLLOW-UP OFFICE VISIT:   HISTORY:  This is an 77 year old black female who has not been seen in  over two years, previously followed for asthma, who according to the  records, has required daily steroid dosing since 2002 and has not been  back for followup in two years.  The more I talk to her about her  medicines, the more confused that I became as to exactly what she is  taking on a daily basis.  However, over the last several weeks, she has  been having increasing symptoms of nocturnal awakening with cough,  congestion, and wheezing.  Is somewhat better after using albuterol.  She denies any associated chest pain, fever, chills, sweats, productive  sputum, sore throat, overt sinus or reflux symptoms, or leg swelling.   For a full inventory of medications as she states them, please see face  sheet, dated August 07, 2007, although I have no confidence at all that  she takes them as they are listed.   PHYSICAL EXAMINATION:  She is a stoic, ambulatory black female in no  acute distress.  She is afebrile with normal vital signs.  HEENT:  Unremarkable.  Her oropharynx is clear.  LUNGS:  Lung fields reveal trace wheeze only.  Overall air movement is  adequate.  HEART:  Regular rhythm without murmur, rub or gallop.  ABDOMEN:  Soft, benign.  EXTREMITIES:  Warm without calf tenderness, clubbing, cyanosis or edema.   Chest x-ray and lab work are pending.   IMPRESSION:  Nocturnal spells of dyspnea that are partially responsive  to albuterol.  May represent true asthma, cardiac asthma, or may be the  result of rhinitis or reflux.  To sort through the differential, I have  recommended checking lab data that is pending and empirically  treating  her with a burst of prednisone over the next six days, starting at 40 mg  and tapering down and adding Zegerid 40 mg at bedtime empirically.  I  would like her to return in one week for a full medication  reconciliation, at which time I would like her to separate her medicines  into maintenance and p.r.n.'s, so we can sort out exactly what she is  taking.  I also spent a great deal of time educating her regarding  general goals of asthma therapy, including how to use albuterol  effectively and emphasizing that Advair is a maintenance, not p.r.n.  drug.     Charlaine Dalton. Sherene Sires, MD, [facility]  Electronically Signed   MBW/MedQ  DD: 08/07/2007  DT: 08/08/2007  Job #: [id number]   cc:   Thora Lance, M.D.

## 2011-04-30 NOTE — Assessment & Plan Note (Signed)
[facility]                             PULMONARY OFFICE NOTE   NAME:[name], Ebony Peterson                        MRN:          [id number]  DATE:10/28/2007                            DOB:          May 20, 1918    HISTORY:  An 77 year old black female who returns for a three-month  follow-up office visit with intermittent spells of wheezing for which  she uses albuterol, but typically not more than once or twice a week.  This is occurring despite being maintained on prednisone at a dose of 10  mg 1/2 every other day.  She denies any pattern to when she is  wheezing in terms of whether it is the days she does not take  prednisone or whether it is related to environmental change.  She denies  any associated chest pain or cough, fevers, chills, sweats, orthopnea,  PND, or leg swelling and actually denies any nocturnal complaints at  present.   The patient is using a medical calendar now that presents a very user  friendly and ambiguous self administration medication record.  It  correlates nicely to the column dated October 28, 2007.   The only issue is the patient is no longer able to get albuterol CFC and  wants to know what she could use instead.   PHYSICAL EXAMINATION:  GENERAL:  She is a pleasant ambulatory black  female in no acute distress.  VITAL SIGNS:  Stable with blood pressure of 132/70.  HEENT:  Unremarkable.  Oropharynx clear.  No thrush.  NECK:  Supple without cervical adenopathy or tenderness.  Trachea is  midline with no thyromegaly.  LUNGS:  Lung fields reveal diminished breath sounds but no wheeze.  HEART:  Regular rate and rhythm without murmurs, rubs, or gallops.  ABDOMEN:  Soft and benign.  EXTREMITIES:  Warm without calf tenderness, cyanosis, clubbing, or  edema.   Most recent PFT's were reviewed from 2004 indicating FEV1 of 72%  predicted with a ratio of 58% and no improvement after bronchodilators.   IMPRESSION:  This patient  appears to have at least moderate chronic  asthma with an irreversible component with intermittent breakthrough,  but not presently breaking the rule of twos.  This is good news.  The  bad news is I found out today that she really is not capable of  triggering albuterol effectively and I spent 15-20 min of 25 minute  visit in conversation with her about management of asthma including how  to use her albuterol effectively.   We will switch her over to Ventolin HFA (it has a counter which will  keep up better with how much she is actually using albuterol) and asked  her to continue the Advair at present dose for now with a low threshold  to increase to the 250/50 b.i.d. dosing if she begins to have  exacerbations.  The other option would be to switch her over to  nebulized Symbicort which is the combination of Brovana and  budesonide, since I would not be confident she could use Symbicort  effectively based on her technique today  which never improved over 50%  effectiveness even with extensive coaching.   Follow-up will be here every three months, sooner if needed since she  remains systemic steroid dependent.     Charlaine Dalton. Sherene Sires, MD, [facility]  Electronically Signed    MBW/MedQ  DD: 10/28/2007  DT: 10/28/2007  Job #: [id number]   cc:   Thora Lance, M.D.

## 2011-05-03 NOTE — Discharge Summary (Signed)
[facility]  Patient:    Ebony Peterson, Ebony Peterson Visit Number: [id number] MRN: [id number]          Service Type: END Location: ENDO Attending Physician:  Sharyn Dross Dictated by:   Charlaine Dalton. Sherene Sires, M.D. [facility] Admit Date:  08/13/2001 Discharge Date: 08/13/2001   CC:         Sharyn Dross., M.D.  Trudee Kuster, M.D.   Discharge Summary  DISCHARGE DIAGNOSES: 1. Acute dyspnea felt to be secondary to asthma exacerbation which in turn was    secondary to nonadherence with maintenance inhalers. 2. Anemia, chronic in nature, to be followed by Dr. Tad Moore as an outpatient. 3. Hypokalemia, resolved. 4. Hypertension, well-controlled. 5. Left breast cancer, status post radiation therapy in 1999, maintained on    Tamoxifen.  HISTORY OF PRESENT ILLNESS:  This is a complicated, but essentially very nice, 77 year old, African-American female who was never a smoker with difficulty controlling asthma that typically does well when she stays on her medications. She was seen in the office on August 23, complaining of worsening dyspnea and it took awhile to sort out the fact that she had actually run out of her inhalers and did not call.  She was really not straight forward about letting us know that she had run out of her medication.  We initially were concerned that there was something else contributing to her dyspnea and she underwent spiral CT scan that showed no evidence of clotting.  HOSPITAL COURSE:  She was treated with IV steroids and placed back on all the medications she was suppose to be taking anyway.  The albuterol was used per nebulizer q.4h. while awake and then switched over to p.r.n.  She was rapidly tapered off of prednisone and her dyspnea, as well as wheezing on exam, dramatically improved.  She is therefore discharged in improved condition back on the medications she was suppose to be on in the first place.  DISCHARGE MEDICATIONS: 1.  Advair 250/50 one b.i.d. 2. Protonix 40 mg one daily before breakfast. 3. Norvasc 5 mg one daily. 4. Citrucel 1 tsp daily. 5. Tamoxifen 20 mg, 10 mg with two tablets daily. 6. Prednisone 20 mg one b.i.d. x 3 days, then one q.d. x 3 days, then 1/2    daily until seen in the office. 7. If she wheezes, she can use albuterol inhaler two puffs every four hours.  Of note, I have told this patient very clearly that she needs to bring a list of discharge medications with her to the office so we can be sure that she keeps up with the list and the medications and not run out of her medications in the future.  In terms of her anemia, this will be followed by Dr. Tad Moore in the office. Dictated by:   Charlaine Dalton. Sherene Sires, M.D. [facility] Attending Physician:  Sharyn Dross DD:  08/21/01 TD:  08/22/01 Job: [id number]

## 2011-05-03 NOTE — Discharge Summary (Signed)
[name], Ebony Peterson                 ACCOUNT NO.:  [id number]   MEDICAL RECORD NO.:  [id number]          PATIENT TYPE:  INP   LOCATION:  5729                         FACILITY:  [facility]   PHYSICIAN:  Barnetta Chapel, MDDATE OF BIRTH:  [birth year]   DATE OF ADMISSION:  01/30/2007  DATE OF DISCHARGE:  02/01/2007                               DISCHARGE SUMMARY   PRIMARY CARE [name]:  Dr. Kirby Funk.   ADMITTING DIAGNOSES:  1. Asthma exacerbation.  2. Hypertension.  3. Upper respiratory illness.   DISCHARGE DIAGNOSES:  1. Asthma exacerbation.  2. Hypertension.  3. Upper respiratory illness.   DISCHARGE MEDICATIONS:  Include:  1. Felodipine 10 mg p.o. once daily.  2. Prednisone 30 mg p.o. once daily for 3 days, then 20 mg once daily      for 2 days, then 10 mg p.o. once daily for 3 days.  3. Albuterol inhaler 2 puffs q.i.d. p.r.n.   BRIEF HISTORY AND HOSPITAL COURSE:  Please refer to the H&P done by Dr.  Doristine Counter on January 30, 2007.  Essentially, the patient is an 76-  year-old female with past medical history significant for asthma and  hypertension.  The patient presented with what appeared to be URI  symptoms with associated shortness of breath.  The patient was admitted  to the regular medical floor.  She was started on neb treatment and p.o.  prednisone.  The patient's symptoms have improved significantly.  On  admission, the patient's blood pressure was 190/99.  She was started on  felodipine 10 mg p.o. once daily.  Vital signs are stable (the temp is  98.7, blood pressure of 117/50, heart rate of 92 and respiratory rate of  14).  The patient has done well and will be discharged back home today  to the care of the primary care physician, Dr. Kirby Funk.   DISCHARGE PLANS:  1. Discharge patient back home today.  2. For patient to follow with Dr. Kirby Funk in 5-7 days.  3. Cardiac diet.  4. Activity as tolerated.  5. The patient also plans to follow up with  her pulmonologist.      Barnetta Chapel, MD  Electronically Signed     SIO/MEDQ  D:  02/01/2007  T:  02/02/2007  Job:  [id number]   cc:   Thora Lance, M.D.

## 2011-05-03 NOTE — Discharge Summary (Signed)
NAMESHAQUOYA, Ebony Peterson NO.:  [id number]   MEDICAL RECORD NO.:  [id number]          PATIENT TYPE:  INP   LOCATION:  4705                         FACILITY:  [facility]   PHYSICIAN:  Thora Lance, M.D.  DATE OF BIRTH:  06-12-1918   DATE OF ADMISSION:  09/20/2004  DATE OF DISCHARGE:  09/21/2004                                 DISCHARGE SUMMARY   REASON FOR ADMISSION:  This is an 77 year old black female who presented  with squeezing chest pressure under the left breast x3 over the four days  prior to admission.  Admitted to rule out MI and for cardiac evaluation.   FINDINGS:  Blood pressure 150/80, heart rate 100, temperature 98.3.  Respiratory examination unremarkable.  EKG showed some inferior lateral T-  wave changes.   LABORATORY DATA:  CBC with wbc 8.3, hemoglobin 11.9, platelet count 232.  PT  13.8, PTT 36.  Sodium 139, potassium 2.5, chloride 105, bicarbonate 29,  glucose 123, BUN 27, creatinine 1.8, calcium 9.2, total protein 6.5, albumin  3.4, AST 27, ALT 20, alkaline phosphatase 65, total bilirubin 0.8.  CK 154,  CK-MB 2.0, troponin I 0.02.   HOSPITAL COURSE:  The patient was admitted for a cardiac evaluation.  Her  potassium was low and this was repleted.  She was seen by Dr. Amil Amen of the  cardiology service.  Adenosine Cardiolite was done on September 21, 2004.  The  study showed no evidence of ischemia.  Cardiac enzymes were normal.  The  patient experienced no further chest pain during the admission.  She was  discharged in good condition.   DISCHARGE DIAGNOSES:  1.  Chest pain.  2.  Chronic obstructive pulmonary disease.  3.  Hypertension.   PROCEDURE:  Adenosine Cardiolite.   DISCHARGE MEDICATIONS:  1.  Prilosec 20 mg one p.o. q.a.m.  2.  Oxybutynin 5 mg one half t.i.d.  3.  Potassium chloride 20 mEq b.i.d.  4.  Advair 100/50 one inhalation b.i.d.  5.  Prednisone 10 mg one half tablet every other day.  6.  Albuterol two puffs q.i.d. p.r.n.  7.   Maxzide 25/37.5 mg one tablet daily.  8.  Calcium +D daily.   DISPOSITION:  Home.   DIET:  Low sodium.   ACTIVITY:  As tolerated.   FOLLOW UP:  Dr. Valentina Lucks.       JJG/MEDQ  D:  10/26/2004  T:  10/26/2004  Job:  [id number]

## 2011-05-03 NOTE — H&P (Signed)
[name], Peterson NO.:  [id number]   MEDICAL RECORD NO.:  [id number]          PATIENT TYPE:  EMS   LOCATION:  MAJO                         FACILITY:  [facility]   PHYSICIAN:  Andres Shad. Rudean Curt, MD     DATE OF BIRTH:  [birth year]   DATE OF ADMISSION:  01/30/2007  DATE OF DISCHARGE:                              HISTORY & PHYSICAL   CHIEF COMPLAINT:  Difficulty breathing.   HISTORY OF PRESENT ILLNESS:  [name] is an 77 year old female with a  past medical history notable for asthma.  She was well until yesterday  when she developed a cough productive of clear sputum as well as  sneezing and upper respiratory congestion.  Today, she developed  significant shortness of breath, including at rest.  She had a  subjective fever and chills.   REVIEW OF SYSTEMS:  Negative for body aches, nausea, vomiting, or chest  pain.   PAST MEDICAL HISTORY:  Asthma, hypertension, possible COPD.   HOME MEDICATIONS:  Albuterol, Megace, potassium chloride, prednisone 5  mg every other day, felodipine, Prilosec.  The patient does not know her  dosages or frequencies.   ALLERGIES:  No known drug allergies.   SOCIAL HISTORY:  The patient has been exposed to a number of people in  the household with upper respiratory infections.  She does not have a  history of smoking.   PHYSICAL EXAMINATION:  VITAL SIGNS:  Temperature 101, blood pressure  190/99, heart rate 110, respiratory rate 22, oxygen saturation 96% on 2  liters nasal cannula, approximately 90% on room air.  GENERAL APPEARANCE:  Patient was alert in no apparent distress.  She was  able to speak full sentences without becoming short of breath.  LUNGS:  Air movement was slightly diminished.  The exam was otherwise  nonfocal.  No wheezes were heard.  CARDIOVASCULAR:  Regular.  Normal S1 and S2.  No murmurs, rubs or  gallops.  ABDOMEN:  Normal bowel sounds.  Soft, nontender, nondistended.  No  organomegaly.  EXTREMITIES:  No edema  .   Chest x-ray preliminarily read as mild peribronchial thickening without  focal air space opacity.   No laboratory studies were performed.   ASSESSMENT/PLAN:  1. Asthma exacerbation in the setting of an upper respiratory      infection:  There is no evidence that the patient has pneumonia,      given the patient's exposure to several household members,      including two children with upper respiratory infections.  It is      most likely that the precipitating infection is viral.  Thus, I      will not give antibiotics at this time; however, I will check a CBC      to see if there is evidence of a white blood cell count elevation;      however, the patient did receive a dose of Solu-Medrol in the      emergency department, and this may be responsible for any elevation      in her white blood cells.  I will  continue her on oral steroids      with prednisone 30 mg orally per day as well as albuterol nebulizer      treatments until she no longer needs oxygen.  2. Hypertension:  The patient is normally on felodipine; however, she      does not know the dose of this medication.  Because of her high      blood pressures, I will withhold IV fluids for now and look up a      standard dose of felodipine to see if that will get her blood      pressure under better control during this hospital course.  3. Gastroesophageal reflux:  The patient normally takes Prilosec.  I      will continue this in the hospital.      Andres Shad. Rudean Curt, MD  Electronically Signed     PML/MEDQ  D:  01/31/2007  T:  01/31/2007  Job:  [id number]

## 2011-05-03 NOTE — H&P (Signed)
[facility]  Patient:    Ebony Peterson, Ebony Peterson Visit Number: [id number] MRN: [id number]          Service Type: MED Location: [phone] Attending Physician:  Avie Echevaria Dictated by:   Charlaine Dalton. Sherene Sires, M.D. [facility] Adm. Date:  08/07/2001   CC:         Sharyn Dross., M.D.  Trudee Kuster, M.D.   History and Physical  HISTORY:  This is an 77 year old black female, never smoker, with chronic asthma, last seen on July 27, 2001 with adequate control while being maintained on regular doses of Advair 100/50 mcg one puff b.i.d.  I had recommended followup by her primary physician and returning to see me p.r.n. In the meantime, she apparently ran out of her Advair (there is a variable history of exactly when she ran out of Advair, in that she told different staff members that she ran out on different occasions); however, over the last two days, she has noticed increasing dyspnea with minimal activity to the point where she cannot even get to the bathroom and back without getting out of breath.  She was seen by my nurse practitioner with this history today and the patient thought that this was much different from her usual asthma exacerbation in that she did not have subjective wheeze nor was there any obvious wheezing on exam.  She therefore was sent to the emergency room where she has undergone evaluation with EKG, chest x-ray and spiral CT scan showing no evidence of pulmonary embolism and no evidence of MI; however, she is anemic in the emergency room and has poor air movement with very distant wheezes on exam and therefore, based on the difficulty taking care of this patient as an outpatient and her tendency to lose track of medications, I recommended that she be admitted for inpatient therapy.  The only other symptom she has had other than dyspnea over the last several days is abdominal swelling.  She has had a recent evaluation by Dr. Dortha Kern, Montez Hageman. including upper and lower endoscopies with no significant findings, and has been placed on what I believe is Protonix 40 mg b.i.d. as an outpatient.  She denies any abdominal pain but states she just feels "full in the abdomen."  She thinks she may have passed "a little bit of red stool" but denies any melena and she is not sure at all that she has been passing blood.  PAST MEDICAL HISTORY: 1. Left breast cancer, status post radiation therapy in 1999. 2. Hypertension.  ALLERGIES:  None.   MEDICATIONS:  Presently, she lists: 1. Norvasc 5 mg q.a.m. 2. Tamoxifen 10 mg tablets two q.a.m. 3. Caltrate 600 mg b.i.d. 4. Megace 40 mg one-half b.i.d. 5. Advair 100/50 mcg one b.i.d. (note that she ran out of it "several days    ago"), (not really sure exactly how consistently she has been taking this). 6. She also has p.r.n. albuterol and Humibid.  SOCIAL HISTORY:  She has never smoked.  She lives independently.  Previously, she was able to do all of her own shopping and driving, in fact, just recently came back five days ago from a funeral in [place], [place].  FAMILY HISTORY:  Her mother and father are dead, unknown reasons.  She has three living siblings, two of whom have diabetes.  No one has cancer or premature heart disease to her knowledge.  REVIEW OF SYSTEMS:  Taken in detail.  She denies any dysphagia, choking  on food, significant hoarseness, change in bowel or bladder habits, myalgias, arthralgias or leg swelling.   Since running out of her inhalers, however, she does state that she feels like she is coming down with a "cold," although she denies any current sputum production or overt sinus complaints, sore throat or dysphagia.  PHYSICAL EXAMINATION:  GENERAL:  This is a somewhat frail, elderly black female who has trouble getting from the bathroom back to the bed without "giving out."  VITAL SIGNS:  She is afebrile with normal vital signs.  HEENT:   Unremarkable.  Oropharynx is clear.  NECK:  Supple without cervical adenopathy or tenderness.  Trachea midline.  No thyromegaly.  CHEST:  Slightly barrel-shaped and hyperresonant to percussion.  She has pan-expiratory wheezing but these breath sounds are markedly diminished and the wheezes are very distant.  CARDIAC:  There is a regular rate and rhythm without murmur, gallop or rub present or displacement of PMI.  BREASTS:  There were no definite breast masses and no nipple or skin changes and no axillary nodes.  ABDOMEN:  Moderately distended and tympanitic with no focal tenderness or organomegaly or masses.  EXTREMITIES:  Warm without calf tenderness, cyanosis, clubbing or edema. Pedal pulses were slightly reduced in a symmetric fashion bilaterally.  NEUROLOGIC:  No focal deficits.  SKIN:  Warm and dry.  LABORATORY DATA:  CT scan of the chest as well as chest x-ray showed no evidence of pulmonary embolism.  Lab studies were remarkable for a hematocrit of 30% with normal indices and a potassium of 3.3, otherwise, all of her lab studies were normal including CPK and troponins.  IMPRESSION: 1. Exacerbation of chronic asthma in this patient with variable history of    exactly what medication she takes and when she runs out of her medicines.    I do not believe she has good insight into her medical therapy at this    point and is at high risk of returning to the emergency room if we try to    send her out after nebulizer therapy; I am therefore going to recommend she    be admitted to the hospital for reinstitution of the medications that I    think she should be taking at baseline anyway including Advair 250/50 mcg    one b.i.d., nebulized Albuterol every four hours p.r.n., and for now,    high-dose Solu-Medrol 80 mg intravenously q.8h. along with high-dose    Protonix. 2. Abdominal distention, unclear etiology.  She has just undergone a recent     gastrointestinal  evaluation by Dr. Tad Moore.  She may simply be having    significant aerophagia from air hunger along with the effects of p.o.    calcium carbonate causing excess CO2 production.  I am going to recommend    Citrucel a teaspoon b.i.d. and stop calcium supplements for now. 3. Anemia with normal indices.  Will check stool studies and also iron studies    and ask Dr. Tad Moore to see her again if she is guaiac positive. 4. Hypertension.  We will continue her on Norvasc 5 mg per day.  Note that she    has no evidence of myocardial ischemia, although this was in the initial    differential diagnosis. 5. We will need to work out with her who is going to follow her long-term to    be sure she does not run out of her medications.  She has variably listed    Dr. Trudee Kuster,  Dr. Tad Moore and myself as being her "regular doctor" but    I suspect she is not getting any kind of regular follow up with anyone and    I cautioned her again today about letting us know before she runs out of    her medicine so that we can provide her samples if she cannot afford her    medicines and cannot obtain refills on prescriptions. Dictated by:   Charlaine Dalton. Sherene Sires, M.D. [facility] Attending Physician:  Avie Echevaria DD:  08/07/01 TD:  08/10/01 Job: [id number]

## 2011-05-03 NOTE — H&P (Signed)
NAMEIKRAM, RIEBE NO.:  [id number]   MEDICAL RECORD NO.:  [id number]          PATIENT TYPE:  INP   LOCATION:  4705                         FACILITY:  [facility]   PHYSICIAN:  Lavinia Sharps, N.P.  DATE OF BIRTH:  11/30/1918   DATE OF ADMISSION:  09/20/2004  DATE OF DISCHARGE:                                HISTORY & PHYSICAL   This is an 77 year old black female with history of hypertension.   CHIEF COMPLAINT:  Squeezing chest pressure under the left breast three times  since the morning of September 16, 2004. These episodes are not associated with  exertion, sweats, or nausea. They last 30 to 45 minutes and with spontaneous  resolution. She came in for evaluation.  Her EKG is abnormal and we decided  to admit for a cardiac evaluation. Her last episode of chest pain was this  morning.   HISTORY OF PRESENT ILLNESS:  As above.   PAST SURGICAL HISTORY:  TAH/BSO in 1978 with bleeding and fibroids.  Left  breast lumpectomy with sentinel node biopsy in August 1999. Bilateral  cataract extraction.   PAST MEDICAL HISTORY:  COPD, mild gait imbalance, chronic edema,  hypertension, dysphagia, urgency of urination.   DRUG ALLERGIES:  NKDA.   CURRENT MEDICATIONS:  1.  Prilosec 20 mg q.a.m.  2.  Oxybutynin 5 mg one-half tablet t.i.d.  3.  Potassium chloride 20 mEq q.a.m.  4.  Advair 100/50 one inhalation b.i.d.  5.  Prednisone 10 mg one-half tablet every other day.  6.  Albuterol inhaler one to two puffs q.i.d. p.r.n.  7.  Triamterene/hydrochlorothiazide 37.5/25 one daily.  8.  Calcium plus D one daily.   FAMILY HISTORY:  Father had history of eczema. Mother with unknown history.  Brother had esophageal cancer. Family history is positive for diabetes,  colon cancer in nephew and cousin.   SOCIAL HISTORY:  She is married, but widowed since November 2003. She lives  with her daughter. 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   REVIEW OF SYSTEMS:   Please see chief complaint.   PHYSICAL EXAMINATION:  GENERAL APPEARANCE:  A well-appearing black female.  VITAL SIGNS: Blood pressure 150/80, heart rate 100, temperature 98.3, weight  130.  HEENT: PERL. EOMs intact. Ears clear. Oropharynx clear.  NECK: Supple with no lymphadenopathy or thyromegaly. No nodules or carotid  bruits.  CHEST: Clear to auscultation.  HEART: Tachy rate with normal rhythm. No murmurs, rubs, gallops, or clicks.  ABDOMEN: Soft, nontender. Positive bowel sounds throughout. No bruits or  masses. No hepatosplenomegaly.  BREASTS: No masses.  GYN/RECTAL EXAM: Deferred.  EXTREMITIES: No edema.  NEUROLOGIC: Reflexes symmetrical.   EKG shows inferior and lateral T-wave changes.   ASSESSMENT:  An 77 year old black female with history of high blood  pressure, chest pain, and electrocardiogram changes.   PLAN:  Admission for cardiac evaluation.       MAP/MEDQ  D:  09/20/2004  T:  09/20/2004  Job:  [id number]

## 2011-05-23 ENCOUNTER — Ambulatory Visit (INDEPENDENT_AMBULATORY_CARE_PROVIDER_SITE_OTHER): Payer: Medicare Other | Admitting: Pulmonary Disease

## 2011-05-23 ENCOUNTER — Telehealth: Payer: Self-pay | Admitting: Internal Medicine

## 2011-05-23 ENCOUNTER — Encounter: Payer: Self-pay | Admitting: Pulmonary Disease

## 2011-05-23 DIAGNOSIS — R0989 Other specified symptoms and signs involving the circulatory and respiratory systems: Secondary | ICD-10-CM

## 2011-05-23 DIAGNOSIS — R079 Chest pain, unspecified: Secondary | ICD-10-CM

## 2011-05-23 DIAGNOSIS — R0609 Other forms of dyspnea: Secondary | ICD-10-CM

## 2011-05-23 MED ORDER — PREDNISONE 10 MG PO TABS: ORAL_TABLET | ORAL | Status: DC

## 2011-05-23 NOTE — Progress Notes (Signed)
Subjective:    Patient ID: Ebony Peterson, female    DOB: 17-Oct-1918, 77 y.o.   MRN: [id number]  HPI 77 yo female with asthma.  She had a terrible night last night.  She felt like she was going to die.  She couldn't catch her breath.  She had a heavy feeling in her chest and left side.  She then was coughing up thick, foamy phlegm.  She was not having fever or wheezing.  She tried taking several puffs of her proair, but this didn't seem to help.  Her chest discomfort and breathing slowly improved, and she is feeling better today.  She has not had headache, sinus congestion, sore throat, or abdominal pain.  She has swelling in her ankles, but this is no different than before.  She continues with advair and prednisone 5 mg every other day.  Past Medical History  Diagnosis Date  . Asthma   . Hypertension      Family History  Problem Relation Age of Onset  . Diabetes Sister   . Diabetes Sister      History   Social History  . Marital Status: Widowed    Spouse Name: N/A    Number of Children: N/A  . Years of Education: N/A   Occupational History  . nurse     Retired   Social History Main Topics  . Smoking status: Never Smoker   . Smokeless tobacco: Not on file  . Alcohol Use: Not on file  . Drug Use: Not on file  . Sexually Active: Not on file   Other Topics Concern  . Not on file   Social History Narrative  . No narrative on file     No Known Allergies   No outpatient prescriptions prior to visit.   Review of Systems     Objective:   Physical Exam  BP 140/68  Pulse 100  Temp(Src) 97.5 F (36.4 C) (Oral)  Ht 4\' 11"  (1.499 m)  Wt 109 lb 12.8 oz (49.805 kg)  BMI 22.18 kg/m2  SpO2 96%  General - No distress HEENT - no sinus tenderness, no oral exudate, no LAN, no thyromegaly, no JVD Cardiac - s1s2, no murmur Chest - no wheeze/rales/dullness/tenderness Abd - soft, non-tender Ext - 1+ b/l ankle edema Neuro - normal strength, CN intact Psych - normal  mood/behavior     ECG - sinus arrhythmia rate 84  Assessment & Plan:   DYSPNEA She had acute onset of symptoms overnight.  Her ECG was unremarkable.  She reports partial improvement in her overnight symptoms with albuterol.  Will give her pulse dose of steroids with gradual taper to her baseline dose.  Will give her sample of advair.  Advised her to call or go to ER if her symptoms recur.    Updated Medication List Outpatient Encounter Prescriptions as of 05/23/2011  Medication Sig Dispense Refill  . albuterol (PROAIR HFA) 108 (90 BASE) MCG/ACT inhaler Inhale 2 puffs into the lungs every 6 (six) hours as needed.        . Fluticasone-Salmeterol (ADVAIR DISKUS) 100-50 MCG/DOSE AEPB Inhale 1 puff into the lungs every 12 (twelve) hours.        . megestrol (MEGACE) 40 MG tablet Take 20 mg by mouth 2 (two) times daily.        Marland Kitchen omeprazole (PRILOSEC) 20 MG capsule Take 20 mg by mouth daily.        Marland Kitchen oxybutynin (DITROPAN) 5 MG tablet Take 5 mg by  mouth 3 (three) times daily.        . potassium chloride SA (KLOR-CON M20) 20 MEQ tablet Take 20 mEq by mouth 2 (two) times daily.        . predniSONE (DELTASONE) 10 MG tablet 2 pills for 3 days, 1.5 pills for 3 days, 1 pill for 3 days, 1/2 pill for 3 days, then 1/2 pill every other day  30 tablet  6  . triamterene-hydrochlorothiazide (MAXZIDE-25) 37.5-25 MG per tablet Take 1 tablet by mouth daily.        Marland Kitchen DISCONTD: predniSONE (DELTASONE) 10 MG tablet 1/2 by mouth every other day or As directed      . DISCONTD: felodipine (PLENDIL) 5 MG 24 hr tablet Take 5 mg by mouth daily.

## 2011-05-23 NOTE — Patient Instructions (Signed)
Prednisone 10 mg pills: 2 pills for 3 days, 1.5 pills for 3 days, 1 pill for 3 days, 1/2 pill for 3 days, then 1/2 pill every other day Call if symptoms do not get better Follow up with pulmonary as needed

## 2011-05-23 NOTE — Assessment & Plan Note (Signed)
She had acute onset of symptoms overnight.  Her ECG was unremarkable.  She reports partial improvement in her overnight symptoms with albuterol.  Will give her pulse dose of steroids with gradual taper to her baseline dose.  Will give her sample of advair.  Advised her to call or go to ER if her symptoms recur.

## 2011-05-23 NOTE — Telephone Encounter (Signed)
Called spoke with patient who is now okay with seeing another physician.  appt scheduled w/ VS this afternoon @ 1345.

## 2011-05-23 NOTE — Telephone Encounter (Signed)
Called spoke with patient who c/o increased SOB onset last PM with some wheezing.  Denies cough.  Pt states she had to use her proair approx every 3 hours and reports using her advair 100-49mcg regularly.  Again offered 4:15 appt with VS this afternoon but pt declines this stating she doesn't want to "see anybody but Dr Sherene Sires."    Dr Sherene Sires, you have no openings today or tomorrow, please advise.

## 2011-05-23 NOTE — Telephone Encounter (Signed)
Pt called back again- wants to see mw today for SOB. i offered her an appt w/ vs this afternoon but she only wants to see wert. Ebony Peterson

## 2011-05-23 NOTE — Telephone Encounter (Signed)
Pt called again states she wants to be seen today.Ebony Peterson

## 2011-07-11 ENCOUNTER — Ambulatory Visit (HOSPITAL_COMMUNITY)
Admission: RE | Admit: 2011-07-11 | Discharge: 2011-07-11 | Disposition: A | Discharge status: Home / Self Care | Payer: Medicare Other | Source: Ambulatory Visit | Attending: Gastroenterology | Admitting: Gastroenterology

## 2011-07-11 DIAGNOSIS — R609 Edema, unspecified: Secondary | ICD-10-CM | POA: Insufficient documentation

## 2011-07-11 DIAGNOSIS — I129 Hypertensive chronic kidney disease with stage 1 through stage 4 chronic kidney disease, or unspecified chronic kidney disease: Secondary | ICD-10-CM | POA: Insufficient documentation

## 2011-07-11 DIAGNOSIS — IMO0002 Reserved for concepts with insufficient information to code with codable children: Secondary | ICD-10-CM | POA: Insufficient documentation

## 2011-07-11 DIAGNOSIS — R6881 Early satiety: Secondary | ICD-10-CM | POA: Insufficient documentation

## 2011-07-11 DIAGNOSIS — R5381 Other malaise: Secondary | ICD-10-CM | POA: Insufficient documentation

## 2011-07-11 DIAGNOSIS — N183 Chronic kidney disease, stage 3 unspecified: Secondary | ICD-10-CM | POA: Insufficient documentation

## 2011-07-11 DIAGNOSIS — R634 Abnormal weight loss: Secondary | ICD-10-CM | POA: Insufficient documentation

## 2011-07-11 DIAGNOSIS — Z853 Personal history of malignant neoplasm of breast: Secondary | ICD-10-CM | POA: Insufficient documentation

## 2011-07-11 DIAGNOSIS — R269 Unspecified abnormalities of gait and mobility: Secondary | ICD-10-CM | POA: Insufficient documentation

## 2011-07-11 DIAGNOSIS — R630 Anorexia: Secondary | ICD-10-CM | POA: Insufficient documentation

## 2011-07-12 ENCOUNTER — Telehealth: Payer: Self-pay | Admitting: Internal Medicine

## 2011-07-12 ENCOUNTER — Ambulatory Visit: Payer: Medicare Other | Admitting: Internal Medicine

## 2011-07-12 NOTE — Telephone Encounter (Signed)
hasnt heard anything yet. Wants a call back asap per pt Ebony Peterson

## 2011-07-12 NOTE — Telephone Encounter (Signed)
Pt's daughter Anice Paganini called again states pt is having difficulty breathing can be reached at [phone].Raylene Everts

## 2011-07-12 NOTE — Telephone Encounter (Signed)
Pt daughter called again.  Spoke with daughter, Ebony Peterson, who reports pt c/o prod cough with thick clear mucus, SOB to the point where she was having difficulty speaking earlier this morning, and some wheezing.  Symptoms began around 9am today.  Donna Christen that if pt's dyspnea is to the point where she can barely speak she needs to go to the ER.  Ebony Peterson relayed this to her mother who refused.  i spoke with patient and reiterated the same.  Again, patient refused to go to the ER or even an UC.  No appts with MW this morning, MR has an opening at 1515.  Per pt's insistence, appt scheduled with MR this afternoon but i strongly urged patient that if her breathing worsens again before the appt she "must go to the emergency room."  Spoke with patient's daughter again and relayed this to her as well.  Ebony Peterson verbalized her understanding and confirmed that she will take pt to the ER if breathing worsens before appt with MR this afternoon.

## 2011-07-13 ENCOUNTER — Emergency Department (HOSPITAL_COMMUNITY): Payer: Medicare Other

## 2011-07-13 ENCOUNTER — Emergency Department (HOSPITAL_COMMUNITY)
Admission: EM | Admit: 2011-07-13 | Discharge: 2011-07-13 | Disposition: A | Discharge status: Home / Self Care | Payer: Medicare Other | Attending: Emergency Medicine | Admitting: Emergency Medicine

## 2011-07-13 DIAGNOSIS — R05 Cough: Secondary | ICD-10-CM | POA: Insufficient documentation

## 2011-07-13 DIAGNOSIS — R0602 Shortness of breath: Secondary | ICD-10-CM | POA: Insufficient documentation

## 2011-07-13 DIAGNOSIS — R059 Cough, unspecified: Secondary | ICD-10-CM | POA: Insufficient documentation

## 2011-07-13 DIAGNOSIS — I10 Essential (primary) hypertension: Secondary | ICD-10-CM | POA: Insufficient documentation

## 2011-07-13 DIAGNOSIS — R062 Wheezing: Secondary | ICD-10-CM | POA: Insufficient documentation

## 2011-07-13 LAB — COMPREHENSIVE METABOLIC PANEL
AST: 20 U/L (ref 0–37)
Albumin: 3.4 g/dL — ABNORMAL LOW (ref 3.5–5.2)
Alkaline Phosphatase: 61 U/L (ref 39–117)
BUN: 27 mg/dL — ABNORMAL HIGH (ref 6–23)
Creatinine, Ser: 1.41 mg/dL — ABNORMAL HIGH (ref 0.50–1.10)
Potassium: 3.8 mEq/L (ref 3.5–5.1)
Total Protein: 7.7 g/dL (ref 6.0–8.3)

## 2011-07-13 LAB — CBC
HCT: 33.5 % — ABNORMAL LOW (ref 36.0–46.0)
Hemoglobin: 11.4 g/dL — ABNORMAL LOW (ref 12.0–15.0)
MCHC: 34 g/dL (ref 30.0–36.0)
MCV: 91 fL (ref 78.0–100.0)
RDW: 12.8 % (ref 11.5–15.5)

## 2011-07-13 LAB — DIFFERENTIAL
Eosinophils Relative: 5 % (ref 0–5)
Lymphocytes Relative: 21 % (ref 12–46)
Lymphs Abs: 2.1 10*3/uL (ref 0.7–4.0)
Monocytes Absolute: 1 10*3/uL (ref 0.1–1.0)
Neutro Abs: 6.3 10*3/uL (ref 1.7–7.7)

## 2011-07-18 NOTE — Op Note (Signed)
  Ebony Peterson, CERCONE NO.:  [id number]  MEDICAL RECORD NO.:  [id number]  LOCATION:  WLEN                         FACILITY:  [facility]  PHYSICIAN:  Danise Edge, M.D.   DATE OF BIRTH:  [birth year]  DATE OF PROCEDURE:  07/11/2011 DATE OF DISCHARGE:                              OPERATIVE REPORT   REFERRING PHYSICIAN:  Thora Lance, M.D.  HISTORY:  Ebony Peterson is a 77 year old female born January 11, 1918. The patient has unexplained anorexia, early satiety, generalized fatigue, and a 20-pound unintentional weight loss over the past 2 years. She denies abdominal pain, dysphagia, diarrhea, constipation, or difficulty urinating.  She denies a history of peptic ulcer disease. She does not take nonsteroidal anti-inflammatory medication.  She takes a small dose of prednisone every other day.  To evaluate early satiety in 2005, the patient underwent a normal esophagogastroduodenoscopy.  According to her office records, the patient has undergone a colonoscopy to the descending colon followed by barium enema and esophagogastroduodenoscopy in 2002.  In June 2012, the patient's hemoglobin was 11.8 g and her serum creatinine was elevated at 1.5.  The patient submitted stool for Hemoccult testing and her stool was Hemoccult positive.  She denies nausea, vomiting, or gastrointestinal bleeding.  PAST MEDICAL AND SURGICAL HISTORY:  Hypertension, chronic asthma, chronic kidney disease, chronic edema, left breast cancer, overactive urinary bladder, mild gait impairment, postherpetic neuralgia in 2004, normal esophagogastroduodenoscopy in 2005, esophagogastroduodenoscopy, incomplete colonoscopy, and barium enema reported to be normal in 2002. Degenerative joint disease of the left hip.  Total abdominal hysterectomy.  Bilateral salpingo-oophorectomy.  Bilateral cataract extractions.  Left breast lumpectomy with sentinel node biopsy in August 1999.  Right breast biopsy in  2000.  MEDICATION ALLERGIES:  None.  ENDOSCOPIST:  Danise Edge, M.D.  PREMEDICATIONS:  Fentanyl 30 mcg, Versed 3 mg.  DESCRIPTION OF PROCEDURE:  The patient was placed in the left lateral decubitus position.  The Pentax gastroscope was passed through the posterior hypopharynx into the proximal esophagus without difficulty. The hypopharynx, larynx and vocal cords appeared normal.  Esophagoscopy:  The proximal, mid and lower segments of the esophageal mucosa appeared normal.  The squamocolumnar junction was noted to 40 cm from the incisor teeth.  There is no endoscopic evidence for the presence of erosive esophagitis or Barrett's esophagus.  Gastroscopy:  Retroflex view of the gastric, cardia and fundus was normal.  The gastric body, antrum and pylorus appeared normal.  Duodenoscopy:  The duodenal bulb and descending duodenum appeared normal.  ASSESSMENT: 1. Normal esophagogastroduodenoscopy. 2. Unexplained early satiety, anorexia, and unintentional weight loss.          ______________________________ Danise Edge, M.D.    MJ/MEDQ  D:  07/11/2011  T:  07/11/2011  Job:  [id number]  cc:   Thora Lance, M.D. Fax: [phone]  Electronically Signed by Danise Edge M.D. on 07/18/2011 04:14:22 PM

## 2011-09-17 ENCOUNTER — Other Ambulatory Visit: Payer: Self-pay | Admitting: Adult Health

## 2011-10-17 ENCOUNTER — Other Ambulatory Visit: Payer: Self-pay | Admitting: Internal Medicine

## 2011-10-17 DIAGNOSIS — Z1231 Encounter for screening mammogram for malignant neoplasm of breast: Secondary | ICD-10-CM

## 2011-11-22 ENCOUNTER — Ambulatory Visit
Admission: RE | Admit: 2011-11-22 | Discharge: 2011-11-22 | Disposition: A | Discharge status: Home / Self Care | Payer: Medicare Other | Source: Ambulatory Visit | Attending: Internal Medicine | Admitting: Internal Medicine

## 2011-11-22 DIAGNOSIS — Z1231 Encounter for screening mammogram for malignant neoplasm of breast: Secondary | ICD-10-CM

## 2012-01-26 ENCOUNTER — Inpatient Hospital Stay (HOSPITAL_COMMUNITY)
Admission: EM | Admit: 2012-01-26 | Discharge: 2012-01-29 | DRG: 203 | Disposition: A | Discharge status: Home / Self Care | Payer: Medicare Other | Attending: Internal Medicine | Admitting: Internal Medicine

## 2012-01-26 ENCOUNTER — Encounter (HOSPITAL_COMMUNITY): Payer: Self-pay | Admitting: Adult Health

## 2012-01-26 ENCOUNTER — Other Ambulatory Visit: Payer: Self-pay

## 2012-01-26 ENCOUNTER — Emergency Department (HOSPITAL_COMMUNITY): Payer: Medicare Other

## 2012-01-26 DIAGNOSIS — J45909 Unspecified asthma, uncomplicated: Secondary | ICD-10-CM

## 2012-01-26 DIAGNOSIS — R0682 Tachypnea, not elsewhere classified: Secondary | ICD-10-CM

## 2012-01-26 DIAGNOSIS — J45901 Unspecified asthma with (acute) exacerbation: Principal | ICD-10-CM | POA: Diagnosis present

## 2012-01-26 DIAGNOSIS — IMO0002 Reserved for concepts with insufficient information to code with codable children: Secondary | ICD-10-CM

## 2012-01-26 DIAGNOSIS — J4521 Mild intermittent asthma with (acute) exacerbation: Secondary | ICD-10-CM | POA: Diagnosis present

## 2012-01-26 DIAGNOSIS — I10 Essential (primary) hypertension: Secondary | ICD-10-CM | POA: Diagnosis present

## 2012-01-26 LAB — BASIC METABOLIC PANEL
CO2: 29 mEq/L (ref 19–32)
GFR calc non Af Amer: 32 mL/min — ABNORMAL LOW (ref >90)
Glucose, Bld: 89 mg/dL (ref 70–99)
Potassium: 3.8 mEq/L (ref 3.5–5.1)
Sodium: 145 mEq/L (ref 135–145)

## 2012-01-26 LAB — DIFFERENTIAL
Eosinophils Relative: 1 % (ref 0–5)
Lymphocytes Relative: 20 % (ref 12–46)
Lymphs Abs: 1.2 10*3/uL (ref 0.7–4.0)
Monocytes Absolute: 0.3 10*3/uL (ref 0.1–1.0)
Neutro Abs: 4.7 10*3/uL (ref 1.7–7.7)

## 2012-01-26 LAB — CBC
HCT: 33.7 % — ABNORMAL LOW (ref 36.0–46.0)
Hemoglobin: 11.3 g/dL — ABNORMAL LOW (ref 12.0–15.0)
MCV: 91.3 fL (ref 78.0–100.0)
RBC: 3.69 MIL/uL — ABNORMAL LOW (ref 3.87–5.11)
WBC: 6.3 10*3/uL (ref 4.0–10.5)

## 2012-01-26 LAB — POCT I-STAT TROPONIN I

## 2012-01-26 MED ORDER — ONDANSETRON HCL 4 MG PO TABS: 4.0000 mg | ORAL_TABLET | Freq: Four times a day (QID) | ORAL | Status: DC | PRN

## 2012-01-26 MED ORDER — GUAIFENESIN-DM 100-10 MG/5ML PO SYRP: 5.0000 mL | ORAL_SOLUTION | ORAL | Status: DC | PRN

## 2012-01-26 MED ORDER — SODIUM CHLORIDE 0.9 % IV SOLN
INTRAVENOUS | Status: DC
  Administered 2012-01-26: 20 mL/h via INTRAVENOUS

## 2012-01-26 MED ORDER — METHYLPREDNISOLONE SODIUM SUCC 125 MG IJ SOLR
60.0000 mg | Freq: Three times a day (TID) | INTRAMUSCULAR | Status: DC
  Administered 2012-01-26: 22:00:00 via INTRAVENOUS
  Filled 2012-01-26 (×4): qty 2

## 2012-01-26 MED ORDER — ALBUTEROL SULFATE (5 MG/ML) 0.5% IN NEBU
2.5000 mg | INHALATION_SOLUTION | Freq: Four times a day (QID) | RESPIRATORY_TRACT | Status: DC
  Administered 2012-01-26 – 2012-01-28 (×6): 2.5 mg via RESPIRATORY_TRACT
  Filled 2012-01-26 (×6): qty 0.5

## 2012-01-26 MED ORDER — ALBUTEROL SULFATE (5 MG/ML) 0.5% IN NEBU
2.5000 mg | INHALATION_SOLUTION | RESPIRATORY_TRACT | Status: DC | PRN
  Administered 2012-01-28: 2.5 mg via RESPIRATORY_TRACT
  Filled 2012-01-26: qty 0.5

## 2012-01-26 MED ORDER — OXYBUTYNIN CHLORIDE 5 MG PO TABS
2.5000 mg | ORAL_TABLET | Freq: Three times a day (TID) | ORAL | Status: DC
  Administered 2012-01-26 – 2012-01-29 (×8): 2.5 mg via ORAL
  Filled 2012-01-26 (×10): qty 0.5

## 2012-01-26 MED ORDER — IPRATROPIUM BROMIDE 0.02 % IN SOLN
0.5000 mg | Freq: Once | RESPIRATORY_TRACT | Status: AC
  Administered 2012-01-26: 0.5 mg via RESPIRATORY_TRACT
  Filled 2012-01-26: qty 2.5

## 2012-01-26 MED ORDER — ALBUTEROL SULFATE (5 MG/ML) 0.5% IN NEBU
5.0000 mg | INHALATION_SOLUTION | Freq: Once | RESPIRATORY_TRACT | Status: AC
  Administered 2012-01-26: 5 mg via RESPIRATORY_TRACT
  Filled 2012-01-26: qty 0.5

## 2012-01-26 MED ORDER — ACETAMINOPHEN 650 MG RE SUPP: 650.0000 mg | Freq: Four times a day (QID) | RECTAL | Status: DC | PRN

## 2012-01-26 MED ORDER — ONDANSETRON HCL 4 MG/2ML IJ SOLN: 4.0000 mg | Freq: Four times a day (QID) | INTRAMUSCULAR | Status: DC | PRN

## 2012-01-26 MED ORDER — HYDROCODONE-ACETAMINOPHEN 5-325 MG PO TABS
1.0000 | ORAL_TABLET | ORAL | Status: DC | PRN
Start: 2012-01-26 — End: 2012-01-29

## 2012-01-26 MED ORDER — ALUM & MAG HYDROXIDE-SIMETH 200-200-20 MG/5ML PO SUSP: 30.0000 mL | Freq: Four times a day (QID) | ORAL | Status: DC | PRN

## 2012-01-26 MED ORDER — TRIAMTERENE-HCTZ 37.5-25 MG PO TABS
1.0000 | ORAL_TABLET | Freq: Every day | ORAL | Status: DC
  Administered 2012-01-27 – 2012-01-29 (×3): 1 via ORAL
  Filled 2012-01-26 (×3): qty 1

## 2012-01-26 MED ORDER — ACETAMINOPHEN 325 MG PO TABS: 650.0000 mg | ORAL_TABLET | Freq: Four times a day (QID) | ORAL | Status: DC | PRN

## 2012-01-26 MED ORDER — PANTOPRAZOLE SODIUM 40 MG PO TBEC
40.0000 mg | DELAYED_RELEASE_TABLET | Freq: Every day | ORAL | Status: DC
  Administered 2012-01-26 – 2012-01-28 (×3): 40 mg via ORAL
  Filled 2012-01-26 (×4): qty 1

## 2012-01-26 MED ORDER — IPRATROPIUM BROMIDE 0.02 % IN SOLN
0.5000 mg | Freq: Four times a day (QID) | RESPIRATORY_TRACT | Status: DC
  Administered 2012-01-26 – 2012-01-28 (×6): 0.5 mg via RESPIRATORY_TRACT
  Filled 2012-01-26 (×6): qty 2.5

## 2012-01-26 MED ORDER — AMLODIPINE BESYLATE 5 MG PO TABS
5.0000 mg | ORAL_TABLET | Freq: Every day | ORAL | Status: DC
  Administered 2012-01-27 – 2012-01-29 (×3): 5 mg via ORAL
  Filled 2012-01-26 (×4): qty 1

## 2012-01-26 MED ORDER — ENOXAPARIN SODIUM 40 MG/0.4ML ~~LOC~~ SOLN
40.0000 mg | Freq: Every day | SUBCUTANEOUS | Status: DC
  Administered 2012-01-27 – 2012-01-28 (×2): 40 mg via SUBCUTANEOUS
  Filled 2012-01-26 (×3): qty 0.4

## 2012-01-26 MED ORDER — POTASSIUM CHLORIDE CRYS ER 20 MEQ PO TBCR
20.0000 meq | EXTENDED_RELEASE_TABLET | Freq: Two times a day (BID) | ORAL | Status: DC
  Administered 2012-01-26 – 2012-01-29 (×6): 20 meq via ORAL
  Filled 2012-01-26 (×7): qty 1

## 2012-01-26 NOTE — ED Notes (Addendum)
SOB that began last night worse while lying flat. Bilateral expiratory wheezes on auscultation. Denies pain. Able to speak in short phrases. Use of accessory muscles. 32 resp. rate

## 2012-01-26 NOTE — ED Provider Notes (Signed)
History     CSN: [id number]  Arrival date & time 01/26/12  1549   First MD Initiated Contact with Patient 01/26/12 1633      Chief Complaint  Patient presents with  . Shortness of Breath     HPI SOB that began last night worse while lying flat. Bilateral expiratory wheezes on auscultation. Denies pain. Able to speak in short phrases. Use of accessory muscles. 32 resp. rate  Past Medical History  Diagnosis Date  . Asthma   . Hypertension     History reviewed. No pertinent past surgical history.  Family History  Problem Relation Age of Onset  . Diabetes Sister   . Diabetes Sister     History  Substance Use Topics  . Smoking status: Never Smoker   . Smokeless tobacco: Not on file  . Alcohol Use: Not on file    OB History    Grav Para Term Preterm Abortions TAB SAB Ect Mult Living                  Review of Systems Negative except as noted in history of present illness Allergies  Review of patient's allergies indicates no known allergies.  Home Medications   Current Outpatient Rx  Name Route Sig Dispense Refill  . ALBUTEROL SULFATE HFA 108 (90 BASE) MCG/ACT IN AERS Inhalation Inhale 2 puffs into the lungs every 6 (six) hours as needed.      Marland Kitchen AMLODIPINE BESYLATE 5 MG PO TABS Oral Take 5 mg by mouth daily.    Marland Kitchen FLUTICASONE-SALMETEROL 100-50 MCG/DOSE IN AEPB Inhalation Inhale 1 puff into the lungs every 12 (twelve) hours.      . MEGESTROL ACETATE 40 MG PO TABS Oral Take 20 mg by mouth 2 (two) times daily as needed. For hot flashes    . OMEPRAZOLE 20 MG PO CPDR Oral Take 20 mg by mouth daily.    . OXYBUTYNIN CHLORIDE 5 MG PO TABS Oral Take 2.5 mg by mouth 3 (three) times daily.     Marland Kitchen POTASSIUM CHLORIDE CRYS ER 20 MEQ PO TBCR Oral Take 20 mEq by mouth 2 (two) times daily.      Marland Kitchen PREDNISONE 10 MG PO TABS Oral Take 5-10 mg by mouth as directed. Take 0.5 tablet every other day, or 1 tablet daily as needed for shortness of breath or wheezing    . TRIAMTERENE-HCTZ  37.5-25 MG PO TABS Oral Take 1 tablet by mouth daily.      Marland Kitchen PREDNISONE 10 MG PO TABS  2 pills for 3 days, 1.5 pills for 3 days, 1 pill for 3 days, 1/2 pill for 3 days, then 1/2 pill every other day      BP 134/66  Pulse 100  Temp 98.6 F (37 C)  Resp 32  SpO2 95%  Physical Exam  Nursing note and vitals reviewed. Constitutional: She is oriented to person, place, and time. She appears well-developed and well-nourished. No distress.  HENT:  Head: Normocephalic and atraumatic.  Eyes: Pupils are equal, round, and reactive to light.  Neck: Normal range of motion.  Cardiovascular: Normal rate and intact distal pulses.   Pulmonary/Chest: Accessory muscle usage present. Tachypnea noted. She has wheezes.  Abdominal: Normal appearance. She exhibits no distension.  Musculoskeletal: Normal range of motion.  Neurological: She is alert and oriented to person, place, and time. No cranial nerve deficit.  Skin: Skin is warm and dry. No rash noted.  Psychiatric: She has a normal mood and affect. Her  behavior is normal.    ED Course  Procedures (including critical care time)  Labs Reviewed  CBC - Abnormal; Notable for the following:    RBC 3.69 (*)    Hemoglobin 11.3 (*)    HCT 33.7 (*)    All other components within normal limits  DIFFERENTIAL  PRO B NATRIURETIC PEPTIDE  POCT I-STAT TROPONIN I   Dg Chest 2 View  01/26/2012  *RADIOLOGY REPORT*  Clinical Data: Shortness of breath  CHEST - 2 VIEW  Comparison: 07/13/2011  Findings: Cardiomediastinal silhouette is stable.  No acute infiltrate or pleural effusion.  No pulmonary edema.  Stable chronic elevation of the left hemidiaphragm. Stable thoracic spine osteopenia  IMPRESSION: No active disease.  Stable chronic elevation of the left hemidiaphragm.  Original Report Authenticated By: Natasha Mead, M.D.   Scheduled Meds:    . albuterol  5 mg Nebulization Once  . ipratropium  0.5 mg Nebulization Once   Continuous Infusions:  PRN  Meds:.   1. ASTHMA   2. Tachypnea       MDM          Nelia Shi, MD 01/26/12 1859

## 2012-01-26 NOTE — ED Notes (Signed)
resp called for breathing treatment

## 2012-01-26 NOTE — ED Notes (Signed)
MD in dept for admit orders.

## 2012-01-26 NOTE — H&P (Signed)
PATIENT DETAILS Name: Ebony Peterson Age: 78 y.o. Sex: female Date of Birth: [birth year] Admit Date: 01/26/2012 WUJ:[id number],WGNF JOSEPH, MD, MD   CHIEF COMPLAINT:  Shortness of breath  HPI: Patient is a 78 year old African American female with a past medical history of hypertension and bronchial asthma, she apparently is on chronic prednisone therapy as well who was brought to the emergency room for the above-noted complaints. The patient for the past 2 days she started experiencing some shortness of breath and was using her albuterol inhaler almost on a regular basis. Last night she was severely dyspneic and had difficulty sleeping, who breathing worsened throughout the day today and hence she presented to the ED for further evaluation. Per ED notes, patient was actively wheezing and was using accessory muscles upon initial presentation. However during my evaluation patient seemed very comfortable and did not seem to be in any sort of respiratory distress. Her lungs were clear on auscultation. Patient claimed to feel significantly better. She denies any chest pain. She denied any syncopal episode. She denied any headache or nausea vomiting or diarrhea.   ALLERGIES:  No Known Allergies  PAST MEDICAL HISTORY: Past Medical History  Diagnosis Date  . Asthma   . Hypertension     PAST SURGICAL HISTORY: History reviewed. No pertinent past surgical history.  MEDICATIONS AT HOME: Prior to Admission medications   Medication Sig Start Date End Date Taking? Authorizing Provider  albuterol (PROAIR HFA) 108 (90 BASE) MCG/ACT inhaler Inhale 2 puffs into the lungs every 6 (six) hours as needed.     Yes Historical Provider, MD  amLODipine (NORVASC) 5 MG tablet Take 5 mg by mouth daily.   Yes Historical Provider, MD  Fluticasone-Salmeterol (ADVAIR DISKUS) 100-50 MCG/DOSE AEPB Inhale 1 puff into the lungs every 12 (twelve) hours.     Yes Historical Provider, MD  megestrol (MEGACE) 40 MG tablet Take 20  mg by mouth 2 (two) times daily as needed. For hot flashes   Yes Historical Provider, MD  omeprazole (PRILOSEC) 20 MG capsule Take 20 mg by mouth daily.   Yes Historical Provider, MD  oxybutynin (DITROPAN) 5 MG tablet Take 2.5 mg by mouth 3 (three) times daily.    Yes Historical Provider, MD  potassium chloride SA (KLOR-CON M20) 20 MEQ tablet Take 20 mEq by mouth 2 (two) times daily.     Yes Historical Provider, MD  predniSONE (DELTASONE) 10 MG tablet Take 5-10 mg by mouth as directed. Take 0.5 tablet every other day, or 1 tablet daily as needed for shortness of breath or wheezing   Yes Historical Provider, MD  triamterene-hydrochlorothiazide (MAXZIDE-25) 37.5-25 MG per tablet Take 1 tablet by mouth daily.     Yes Historical Provider, MD  predniSONE (DELTASONE) 10 MG tablet 2 pills for 3 days, 1.5 pills for 3 days, 1 pill for 3 days, 1/2 pill for 3 days, then 1/2 pill every other day 05/23/11 05/22/12  Coralyn Helling, MD    FAMILY HISTORY: Family History  Problem Relation Age of Onset  . Diabetes Sister   . Diabetes Sister     SOCIAL HISTORY:  reports that she has never smoked. She does not have any smokeless tobacco history on file. Her alcohol and drug histories not on file.  REVIEW OF SYSTEMS:  Constitutional:   No  weight loss, night sweats,  Fevers, chills, fatigue.  HEENT:    No headaches, Difficulty swallowing,Tooth/dental problems,Sore throat,  No sneezing, itching, ear ache, nasal congestion, post nasal drip,  Cardio-vascular: No chest pain,  Orthopnea, PND, swelling in lower extremities, anasarca,dizziness, palpitations  GI:  No heartburn, indigestion, abdominal pain, nausea, vomiting, diarrhea, change in       bowel habits, loss of appetite  Resp:  No coughing up of blood.No chest wall deformity  Skin:  no rash or lesions.  GU:  no dysuria, change in color of urine, no urgency or frequency.  No flank pain.  Musculoskeletal: No joint pain or swelling.  No decreased  range of motion.  No back pain.  Psych: No change in mood or affect. No depression or anxiety.  No memory loss.   PHYSICAL EXAM: Blood pressure 134/66, pulse 100, temperature 98.6 F (37 C), resp. rate 32, SpO2 95.00%.  General appearance :Awake, alert, not in any distress. Speech Clear. Not toxic Looking HEENT: Atraumatic and Normocephalic, pupils equally reactive to light and accomodation Neck: supple, no JVD. No cervical lymphadenopathy.  Chest:Good air entry bilaterally, no added sounds  CVS: S1 S2 regular, no murmurs.  Abdomen: Bowel sounds present, Non tender and not distended with no gaurding, rigidity or rebound. Extremities: B/L Lower Ext shows no edema, both legs are warm to touch, with  dorsalis pedis pulses palpable. Neurology: Awake alert, and oriented X 3, CN II-XII intact, Non focal Skin:No Rash Wounds:N/A  LABS ON ADMISSION:   Basename 01/26/12 2057  NA 145  K 3.8  CL 107  CO2 29  GLUCOSE 89  BUN 22  CREATININE 1.41*  CALCIUM 9.9  MG --  PHOS --   No results found for this basename: AST:2,ALT:2,ALKPHOS:2,BILITOT:2,PROT:2,ALBUMIN:2 in the last 72 hours No results found for this basename: LIPASE:2,AMYLASE:2 in the last 72 hours  Basename 01/26/12 1656  WBC 6.3  NEUTROABS 4.7  HGB 11.3*  HCT 33.7*  MCV 91.3  PLT 275   No results found for this basename: CKTOTAL:3,CKMB:3,CKMBINDEX:3,TROPONINI:3 in the last 72 hours No results found for this basename: DDIMER:2 in the last 72 hours No components found with this basename: POCBNP:3   RADIOLOGIC STUDIES ON ADMISSION: Dg Chest 2 View  01/26/2012  *RADIOLOGY REPORT*  Clinical Data: Shortness of breath  CHEST - 2 VIEW  Comparison: 07/13/2011  Findings: Cardiomediastinal silhouette is stable.  No acute infiltrate or pleural effusion.  No pulmonary edema.  Stable chronic elevation of the left hemidiaphragm. Stable thoracic spine osteopenia  IMPRESSION: No active disease.  Stable chronic elevation of the left  hemidiaphragm.  Original Report Authenticated By: Natasha Mead, M.D.    ASSESSMENT AND PLAN: Present on Admission:  .Exacerbation of intermittent asthma -We'll admit to a regular medical bed  -Placed on Solu-Medrol and scheduled nebulized bronchodilator  -She is afebrile and her chest x-ray does not show any infiltrates hence she will not be placed on any antibiotics   .HYPERTENSION -We'll continue her usual medication  Further plan will depend as patient's clinical course evolves and further radiologic and laboratory data become available. Patient will be monitored closely.   DVT Prophylaxis: Subcutaneous lovenox  Code Status: Full code-however the patient will talk to her daughter and clarify CODE STATUS  Total time spent for admission equals 45 minutes.  Jeoffrey Massed 01/26/2012, 9:43 PM

## 2012-01-27 LAB — BASIC METABOLIC PANEL
Calcium: 9.4 mg/dL (ref 8.4–10.5)
GFR calc non Af Amer: 28 mL/min — ABNORMAL LOW (ref >90)
Glucose, Bld: 136 mg/dL — ABNORMAL HIGH (ref 70–99)
Sodium: 141 mEq/L (ref 135–145)

## 2012-01-27 LAB — CBC
Hemoglobin: 10.3 g/dL — ABNORMAL LOW (ref 12.0–15.0)
MCH: 30 pg (ref 26.0–34.0)
MCHC: 33.2 g/dL (ref 30.0–36.0)

## 2012-01-27 MED ORDER — METHYLPREDNISOLONE SODIUM SUCC 125 MG IJ SOLR
60.0000 mg | Freq: Two times a day (BID) | INTRAMUSCULAR | Status: DC
  Administered 2012-01-27 (×2): 60 mg via INTRAVENOUS
  Filled 2012-01-27 (×4): qty 0.96

## 2012-01-27 NOTE — Progress Notes (Signed)
Subjective: Feels better  Objective: Vital signs in last 24 hours: Temp:  [97.4 F (36.3 C)-98.6 F (37 C)] 97.4 F (36.3 C) (02/11 0445) Pulse Rate:  [69-105] 69  (02/11 0445) Resp:  [19-32] 20  (02/11 0445) BP: (121-159)/(60-66) 121/64 mmHg (02/11 0445) SpO2:  [95 %-99 %] 97 % (02/11 0445) Weight:  [49.896 kg (110 lb)] 49.896 kg (110 lb) (02/10 2238) Weight change:     Intake/Output from previous day: 02/10 0701 - 02/11 0700 In: 400.3 [P.O.:240; I.V.:160.3] Out: 375 [Urine:375] Intake/Output this shift:    General appearance: alert and cooperative Resp: clear to auscultation bilaterally Cardio: regular rate and rhythm, S1, S2 normal, no murmur, click, rub or gallop GI: soft, non-tender; bowel sounds normal; no masses,  no organomegaly Extremities: extremities normal, atraumatic, no cyanosis or edema  Lab Results:  Basename 01/27/12 0427 01/26/12 1656  WBC 4.9 6.3  HGB 10.3* 11.3*  HCT 31.0* 33.7*  PLT 256 275   BMET  Basename 01/27/12 0427 01/26/12 2057  NA 141 145  K 4.1 3.8  CL 106 107  CO2 28 29  GLUCOSE 136* 89  BUN 25* 22  CREATININE 1.56* 1.41*  CALCIUM 9.4 9.9    Studies/Results: Dg Chest 2 View  01/26/2012  *RADIOLOGY REPORT*  Clinical Data: Shortness of breath  CHEST - 2 VIEW  Comparison: 07/13/2011  Findings: Cardiomediastinal silhouette is stable.  No acute infiltrate or pleural effusion.  No pulmonary edema.  Stable chronic elevation of the left hemidiaphragm. Stable thoracic spine osteopenia  IMPRESSION: No active disease.  Stable chronic elevation of the left hemidiaphragm.  Original Report Authenticated By: Natasha Mead, M.D.    Medications: I have reviewed the patient's current medications.  Assessment/Plan: Principal Problem:  *Exacerbation of intermittent asthma  Improved, continue steroids and bronchodilaters. Ambulate Active Problems:  HYPERTENSION ok   LOS: 1 day   Daizy Outen JOSEPH 01/27/2012, 7:09 AM

## 2012-01-27 NOTE — Evaluation (Signed)
Physical Therapy Evaluation Patient Details Name: Ebony Peterson MRN: [id number] DOB: [birth year] Today's Date: 01/27/2012  Problem List:  Patient Active Problem List  Diagnoses  . NEOPLASM, MALIGNANT, BREAST  . HYPERTENSION  . ASTHMA  . DYSPNEA  . Exacerbation of intermittent asthma    Past Medical History:  Past Medical History  Diagnosis Date  . Asthma   . Hypertension    Past Surgical History: History reviewed. No pertinent past surgical history.  PT Assessment/Plan/Recommendation PT Assessment Clinical Impression Statement: pt doing well; would benefit from PT to maximize independence for home with high level balance training PT Recommendation/Assessment: Patient will need skilled PT in the acute care venue PT Problem List: Decreased balance;Decreased coordination PT Plan PT Frequency: Min 3X/week PT Treatment/Interventions: Gait training;Functional mobility training;Therapeutic exercise;Balance training PT Recommendation Follow Up Recommendations: No PT follow up Equipment Recommended: Cane PT Goals  Acute Rehab PT Goals PT Goal Formulation: With patient Time For Goal Achievement: 7 days Pt will Ambulate: >150 feet;with modified independence;with cane (uneven surface) PT Goal: Ambulate - Progress: Goal set today Additional Goals Additional Goal #1:  (pt will score 42 on BERG)  PT Evaluation Precautions/Restrictions  Precautions Precautions: Fall Prior Functioning  Home Living Lives With: Daughter Receives Help From: Family (grandchildren--24yo and 18yo) Type of Home: House Home Layout: One level Home Access: Stairs to enter Entrance Stairs-Rails: None Entrance Stairs-Number of Steps: 1 Home Adaptive Equipment: Straight cane (needs new cane or at least tip) Prior Function Level of Independence: Independent with basic ADLs;Independent with gait Vocation: Retired Art therapist   Extremity Assessment RUE Assessment RUE Assessment:  Exceptions to [facility] LUE Assessment LUE Assessment: Within Functional Limits (limited shoulder flesion) RLE Assessment RLE Assessment: Exceptions to [facility] LLE Assessment LLE Assessment: Within Functional Limits (limited shoulder flexion) Mobility (including Balance) Bed Mobility Bed Mobility: Yes Supine to Sit: 7: Independent Transfers Transfers: Yes Sit to Stand: 7: Independent Stand to Sit: 7: Independent Ambulation/Gait Ambulation/Gait: Yes Ambulation/Gait Assistance: 5: Supervision Ambulation/Gait Assistance Details (indicate cue type and reason): for safety Ambulation Distance (Feet): 350 Feet Assistive device: None Gait Pattern: Step-through pattern Stairs: No  Berg Balance Test Sit to Stand: Able to stand  independently using hands Standing Unsupported: Able to stand safely 2 minutes Sitting with Back Unsupported but Feet Supported on Floor or Stool: Able to sit safely and securely 2 minutes Stand to Sit: Sits safely with minimal use of hands Transfers: Able to transfer safely, minor use of hands Standing Unsupported with Eyes Closed: Able to stand 10 seconds with supervision Standing Ubsupported with Feet Together: Able to place feet together independently and stand 1 minute safely From Standing, Reach Forward with Outstretched Arm: Loses balance while trying/requires external support From Standing Position, Pick up Object from Floor: Able to pick up shoe safely and easily From Standing Position, Turn to Look Behind Over each Shoulder: Needs supervision when turning Turn 360 Degrees: Able to turn 360 degrees safely in 4 seconds or less Standing Unsupported, Alternately Place Feet on Step/Stool: Able to complete >2 steps/needs minimal assist Standing Unsupported, One Foot in Front: Needs help to step but can hold 15 seconds Standing on One Leg: Unable to try or needs assist to prevent fall Total Score: 37  Exercise    End of Session PT - End of Session Equipment Utilized  During Treatment: Gait belt Activity Tolerance: Patient tolerated treatment well Patient left: in chair;with call bell in reach Nurse Communication: Mobility status for transfers;Mobility status for ambulation General Behavior  During Session: [facility] for tasks performed Cognition: [facility] for tasks performed  [facility] 01/27/2012, 12:56 PM

## 2012-01-28 MED ORDER — PREDNISONE 50 MG PO TABS
50.0000 mg | ORAL_TABLET | Freq: Every day | ORAL | Status: DC
  Administered 2012-01-28 – 2012-01-29 (×2): 50 mg via ORAL
  Filled 2012-01-28 (×2): qty 1

## 2012-01-28 MED ORDER — IPRATROPIUM BROMIDE 0.02 % IN SOLN: 0.5000 mg | Freq: Three times a day (TID) | RESPIRATORY_TRACT | Status: DC

## 2012-01-28 MED ORDER — ALBUTEROL SULFATE (5 MG/ML) 0.5% IN NEBU: 2.5000 mg | INHALATION_SOLUTION | Freq: Three times a day (TID) | RESPIRATORY_TRACT | Status: DC

## 2012-01-28 NOTE — Progress Notes (Signed)
Subjective: Feels much better, less SOB  Objective: Vital signs in last 24 hours: Temp:  [98.2 F (36.8 C)-98.5 F (36.9 C)] 98.5 F (36.9 C) (02/11 2200) Pulse Rate:  [97] 97  (02/11 2200) Resp:  [18] 18  (02/11 2200) BP: (125-136)/(68-74) 136/74 mmHg (02/11 2200) SpO2:  [96 %-100 %] 98 % (02/12 0221) Weight change:     Intake/Output from previous day: 02/11 0701 - 02/12 0700 In: 640.3 [P.O.:160; I.V.:480.3] Out: 425 [Urine:425] Intake/Output this shift: Total I/O In: 480.3 [I.V.:480.3] Out: -   Resp: wheezes bilaterally Cardio: regular rate and rhythm, S1, S2 normal, no murmur, click, rub or gallop  Lab Results:  Basename 01/27/12 0427 01/26/12 1656  WBC 4.9 6.3  HGB 10.3* 11.3*  HCT 31.0* 33.7*  PLT 256 275   BMET  Basename 01/27/12 0427 01/26/12 2057  NA 141 145  K 4.1 3.8  CL 106 107  CO2 28 29  GLUCOSE 136* 89  BUN 25* 22  CREATININE 1.56* 1.41*  CALCIUM 9.4 9.9    Studies/Results: Dg Chest 2 View  01/26/2012  *RADIOLOGY REPORT*  Clinical Data: Shortness of breath  CHEST - 2 VIEW  Comparison: 07/13/2011  Findings: Cardiomediastinal silhouette is stable.  No acute infiltrate or pleural effusion.  No pulmonary edema.  Stable chronic elevation of the left hemidiaphragm. Stable thoracic spine osteopenia  IMPRESSION: No active disease.  Stable chronic elevation of the left hemidiaphragm.  Original Report Authenticated By: Natasha Mead, M.D.    Medications: I have reviewed the patient's current medications.  Assessment/Plan: Principal Problem:  *Exacerbation of intermittent asthma much improved. Change to po steroids. Hope to discharge in am Active Problems:  HYPERTENSION controlled   LOS: 2 days   Rayonna Heldman JOSEPH 01/28/2012, 6:30 AM

## 2012-01-28 NOTE — Progress Notes (Signed)
Physical Therapy Treatment Patient Details Name: BRIANNE MAINA MRN: [id number] DOB: [birth year] Today's Date: 01/28/2012  PT Assessment/Plan  PT - Assessment/Plan Comments on Treatment Session: Pt ambulated in halls and attempted dynamic balance activities to improve balance but pt reluctant to continue activity upon initial attempt. PT Plan: Discharge plan remains appropriate;Frequency remains appropriate Follow Up Recommendations: No PT follow up Equipment Recommended: None recommended by PT (cane present in room, pt reports she owns cane) PT Goals  Acute Rehab PT Goals PT Goal: Ambulate - Progress: Progressing toward goal  PT Treatment Precautions/Restrictions  Precautions Precautions: Fall Mobility (including Balance) Bed Mobility Bed Mobility: Yes Supine to Sit: 7: Independent Transfers Transfers: Yes Sit to Stand: 7: Independent Stand to Sit: 7: Independent Ambulation/Gait Ambulation/Gait: Yes Ambulation/Gait Assistance: 5: Supervision Ambulation/Gait Assistance Details (indicate cue type and reason): supervision with regular ambulation, min/guard to minA when attempting dynamic balance challenges: tightrope walking, head turns, stop turn around.  pt reports she is usually very careful and slow while turning if she forgets something or if someone calls to her she doesn't turn her head but stops and turns to face them.  pt also reports, "I can't do that, I am 89 you know."  so attempted balance activities but pt reluctant to continue. Ambulation Distance (Feet): 400 Feet (total) Assistive device: None Gait Pattern: Step-through pattern Stairs: No    Exercise    End of Session PT - End of Session Equipment Utilized During Treatment: Gait belt Activity Tolerance: Patient tolerated treatment well Patient left: in chair;with call bell in reach General Behavior During Session: [facility] for tasks performed Cognition: [facility] for tasks performed  Rahkeem Senft,KATHrine E 01/28/2012, 3:34  PM Pager: [phone]

## 2012-01-29 MED ORDER — PREDNISONE 5 MG PO TABS: 10.0000 mg | ORAL_TABLET | Freq: Every day | ORAL | Status: AC

## 2012-01-29 NOTE — Discharge Summary (Signed)
Physician Discharge Summary  Patient ID: Ebony Peterson MRN: [id number] DOB/AGE: 1918-07-26 78 y.o.  Admit date: 01/26/2012 Discharge date: 01/29/2012  Admission Diagnoses: Asthma exacerbation Hypertension  Discharge Diagnoses:  Principal Problem:  *Exacerbation of intermittent asthma Active Problems:  HYPERTENSION   Discharged Condition: good  Hospital Course: The patient was admitted with shortness of breath and wheezing for an acute asthma exacerbation. Her chest x-ray was negative. There is no sign of acute infection. The patient was treated with Solu-Medrol and bronchodilators. Within 1 hospital day she was much better with improved wheezing or shortness of breath. On the day prior to admission she ambulated without difficulty or shortness of breath. She was changed to oral steroids. Her vital signs remained stable. She was discharged in good condition.  Diet: Low-sodium diet Activity as tolerated. Physical therapy saw no need for continued PT CODE STATUS: Full code  Consults: None  Significant Diagnostic Studies: labs:  and radiology: CXR: Elevated left hemidiaphragm  Treatments: steroids: solu-medrol and prednisone and respiratory therapy: albuterol/atropine nebulizer  Discharge Exam: Blood pressure 139/68, pulse 106, temperature 99 F (37.2 C), temperature source Oral, resp. rate 20, height 4\' 11"  (1.499 m), weight 49.896 kg (110 lb), SpO2 98.00%. Resp: clear to auscultation bilaterally Cardio: regular rate and rhythm, S1, S2 normal, no murmur, click, rub or gallop  Disposition: Home or Self Care   Medication List  As of 01/29/2012  6:58 AM   STOP taking these medications         megestrol 40 MG tablet      predniSONE 10 MG tablet         TAKE these medications         ADVAIR DISKUS 100-50 MCG/DOSE Aepb   Generic drug: Fluticasone-Salmeterol   Inhale 1 puff into the lungs every 12 (twelve) hours.      amLODipine 5 MG tablet   Commonly known as: NORVASC   Take 5 mg by mouth daily.      KLOR-CON M20 20 MEQ tablet   Generic drug: potassium chloride SA   Take 20 mEq by mouth 2 (two) times daily.      omeprazole 20 MG capsule   Commonly known as: PRILOSEC   Take 20 mg by mouth daily.      oxybutynin 5 MG tablet   Commonly known as: DITROPAN   Take 2.5 mg by mouth 3 (three) times daily.      predniSONE 5 MG tablet   Commonly known as: DELTASONE   Take 2 tablets (10 mg total) by mouth daily.      PROAIR HFA 108 (90 BASE) MCG/ACT inhaler   Generic drug: albuterol   Inhale 2 puffs into the lungs every 6 (six) hours as needed.      triamterene-hydrochlorothiazide 37.5-25 MG per tablet   Commonly known as: MAXZIDE-25   Take 1 tablet by mouth daily.           Follow-up Information    Follow up with Lillia Mountain, MD in 8 days.         SignedLillia Mountain 01/29/2012, 6:58 AM

## 2012-01-29 NOTE — Discharge Instructions (Signed)
Call us if you become more SOB.  Drop by my office today to get some advair.

## 2012-04-13 ENCOUNTER — Emergency Department (HOSPITAL_COMMUNITY): Payer: Medicare Other

## 2012-04-13 ENCOUNTER — Emergency Department (HOSPITAL_COMMUNITY)
Admission: EM | Admit: 2012-04-13 | Discharge: 2012-04-13 | Disposition: A | Discharge status: Home / Self Care | Payer: Medicare Other | Attending: Emergency Medicine | Admitting: Emergency Medicine

## 2012-04-13 ENCOUNTER — Encounter (HOSPITAL_COMMUNITY): Payer: Self-pay | Admitting: Emergency Medicine

## 2012-04-13 DIAGNOSIS — J45901 Unspecified asthma with (acute) exacerbation: Secondary | ICD-10-CM | POA: Insufficient documentation

## 2012-04-13 DIAGNOSIS — J4521 Mild intermittent asthma with (acute) exacerbation: Secondary | ICD-10-CM

## 2012-04-13 DIAGNOSIS — R0602 Shortness of breath: Secondary | ICD-10-CM | POA: Insufficient documentation

## 2012-04-13 DIAGNOSIS — I10 Essential (primary) hypertension: Secondary | ICD-10-CM | POA: Insufficient documentation

## 2012-04-13 DIAGNOSIS — J441 Chronic obstructive pulmonary disease with (acute) exacerbation: Secondary | ICD-10-CM | POA: Insufficient documentation

## 2012-04-13 DIAGNOSIS — R072 Precordial pain: Secondary | ICD-10-CM | POA: Insufficient documentation

## 2012-04-13 DIAGNOSIS — R6884 Jaw pain: Secondary | ICD-10-CM | POA: Insufficient documentation

## 2012-04-13 HISTORY — DX: Malignant neoplasm of unspecified site of unspecified female breast: C50.919

## 2012-04-13 HISTORY — DX: Chronic obstructive pulmonary disease, unspecified: J44.9

## 2012-04-13 LAB — DIFFERENTIAL
Basophils Absolute: 0 10*3/uL (ref 0.0–0.1)
Basophils Relative: 0 % (ref 0–1)
Eosinophils Absolute: 0.6 10*3/uL (ref 0.0–0.7)
Neutrophils Relative %: 55 % (ref 43–77)

## 2012-04-13 LAB — CBC
MCH: 31 pg (ref 26.0–34.0)
MCHC: 33.8 g/dL (ref 30.0–36.0)
Platelets: 277 10*3/uL (ref 150–400)
RBC: 3.61 MIL/uL — ABNORMAL LOW (ref 3.87–5.11)

## 2012-04-13 LAB — BASIC METABOLIC PANEL
Calcium: 9 mg/dL (ref 8.4–10.5)
GFR calc non Af Amer: 31 mL/min — ABNORMAL LOW (ref >90)
Sodium: 140 mEq/L (ref 135–145)

## 2012-04-13 LAB — POCT I-STAT TROPONIN I: Troponin i, poc: 0.01 ng/mL (ref 0.00–0.08)

## 2012-04-13 LAB — PRO B NATRIURETIC PEPTIDE: Pro B Natriuretic peptide (BNP): 180 pg/mL (ref 0–450)

## 2012-04-13 MED ORDER — ALBUTEROL SULFATE (5 MG/ML) 0.5% IN NEBU
5.0000 mg | INHALATION_SOLUTION | Freq: Once | RESPIRATORY_TRACT | Status: AC
  Administered 2012-04-13: 5 mg via RESPIRATORY_TRACT
  Filled 2012-04-13: qty 1

## 2012-04-13 MED ORDER — IPRATROPIUM BROMIDE 0.02 % IN SOLN
0.5000 mg | RESPIRATORY_TRACT | Status: AC
  Administered 2012-04-13: 0.5 mg via RESPIRATORY_TRACT
  Filled 2012-04-13: qty 2.5

## 2012-04-13 MED ORDER — PREDNISONE 10 MG PO TABS: 30.0000 mg | ORAL_TABLET | Freq: Every day | ORAL | Status: DC

## 2012-04-13 NOTE — ED Provider Notes (Signed)
History     CSN: [id number]  Arrival date & time 04/13/12  1009   First MD Initiated Contact with Patient 04/13/12 1028      Chief Complaint  Patient presents with  . Shortness of Breath    (Consider location/radiation/quality/duration/timing/severity/associated sxs/prior treatment) HPI Hx from pt. 78yo F with PMH asthma/COPD presents with shortness of breath. States this is chronic for her but worsened last evening. She has an albuterol inhaler at home, which she typically uses as needed for shortness of breath. She uses twice this morning without relief. Shortness of breath worsens with lying flat and improves with sitting upright. She has some slight chest tightness with this which is typical with her asthma exacerbations. She denies fever or chills. Has not had a new cough, but does admit some congestion. Denies any diaphoresis, abdominal pain, nausea, vomiting, diarrhea.  She does mention that she has had some new chest pain intermittently over the past 2 weeks, and is scheduled to have a stress test with cardiology on Thursday of this week. She was seen by her PCP, Dr. Valentina Lucks for this, who set her up for the stress test. She has no chest pain at this time. She describes the pain that she previously had as sharp, located substernally with some radiation to her jaw. It was episodic in nature, occurring twice while she was lying in bed. Episode lasted about 10 minutes. She had no associated shortness of breath, diaphoresis, nausea, vomiting, abdominal pain.  Past Medical History  Diagnosis Date  . Asthma   . Hypertension   . COPD (chronic obstructive pulmonary disease)   . Breast CA     Past Surgical History  Procedure Date  . Mastectomy     Family History  Problem Relation Age of Onset  . Diabetes Sister   . Diabetes Sister     History  Substance Use Topics  . Smoking status: Never Smoker   . Smokeless tobacco: Not on file  . Alcohol Use: No    OB History    Grav  Para Term Preterm Abortions TAB SAB Ect Mult Living                  Review of Systems  Constitutional: Negative for fever, chills, activity change and appetite change.  HENT: Positive for congestion. Negative for sore throat.   Respiratory: Positive for chest tightness and shortness of breath. Negative for cough.   Cardiovascular: Negative for chest pain and palpitations.  Gastrointestinal: Negative for nausea, vomiting and abdominal pain.  Skin: Negative for color change.  Neurological: Negative for dizziness and weakness.  All other systems reviewed and are negative.    Allergies  Review of patient's allergies indicates no known allergies.  Home Medications   Current Outpatient Rx  Name Route Sig Dispense Refill  . ALBUTEROL SULFATE HFA 108 (90 BASE) MCG/ACT IN AERS Inhalation Inhale 2 puffs into the lungs every 6 (six) hours as needed.      Marland Kitchen AMLODIPINE BESYLATE 5 MG PO TABS Oral Take 5 mg by mouth daily.    Marland Kitchen FLUTICASONE-SALMETEROL 100-50 MCG/DOSE IN AEPB Inhalation Inhale 1 puff into the lungs every 12 (twelve) hours.      . OMEPRAZOLE 20 MG PO CPDR Oral Take 20 mg by mouth daily.    . OXYBUTYNIN CHLORIDE 5 MG PO TABS Oral Take 2.5 mg by mouth 3 (three) times daily.     Marland Kitchen POTASSIUM CHLORIDE CRYS ER 20 MEQ PO TBCR Oral Take 20 mEq  by mouth 2 (two) times daily.      . TRIAMTERENE-HCTZ 37.5-25 MG PO TABS Oral Take 1 tablet by mouth daily.        BP 128/64  Pulse 82  Temp(Src) 98.3 F (36.8 C) (Oral)  Resp 28  SpO2 96%  Physical Exam  Nursing note and vitals reviewed. Constitutional: She is oriented to person, place, and time. She appears well-developed and well-nourished. No distress.  HENT:  Head: Normocephalic and atraumatic.  Right Ear: External ear normal.  Left Ear: External ear normal.  Mouth/Throat: Oropharynx is clear and moist. No oropharyngeal exudate.  Eyes: EOM are normal. Pupils are equal, round, and reactive to light.  Neck: Normal range of motion.  Neck supple.  Cardiovascular: Normal rate, regular rhythm and normal heart sounds.   Pulmonary/Chest: Effort normal and breath sounds normal. She exhibits no tenderness.       Reduced air movement throughout  Abdominal: Soft. Bowel sounds are normal.  Musculoskeletal: Normal range of motion.  Neurological: She is alert and oriented to person, place, and time.  Skin: Skin is warm and dry. She is not diaphoretic.  Psychiatric: She has a normal mood and affect.    ED Course  Procedures (including critical care time)   Date: 04/13/2012  Rate: 74  Rhythm: normal sinus rhythm  QRS Axis: normal  Intervals: normal  ST/T Wave abnormalities: nonspecific ST changes V1-V3  Conduction Disutrbances:none  Narrative Interpretation:   Old EKG Reviewed: as compared with Jan 26, 2012: rate decreased today, ST morphology in V1-V3 appears new    Labs Reviewed  CBC - Abnormal; Notable for the following:    RBC 3.61 (*)    Hemoglobin 11.2 (*)    HCT 33.1 (*)    All other components within normal limits  DIFFERENTIAL - Abnormal; Notable for the following:    Eosinophils Relative 8 (*)    All other components within normal limits  BASIC METABOLIC PANEL - Abnormal; Notable for the following:    BUN 27 (*)    Creatinine, Ser 1.41 (*)    GFR calc non Af Amer 31 (*)    GFR calc Af Amer 36 (*)    All other components within normal limits  PRO B NATRIURETIC PEPTIDE  POCT I-STAT TROPONIN I  POCT I-STAT TROPONIN I   Dg Chest 2 View  04/13/2012  *RADIOLOGY REPORT*  Clinical Data: Shortness of breath.  History of COPD.  CHEST - 2 VIEW  Comparison: Multiple priors, most recently chest x-ray 01/26/2012.  Findings: There is marked elevation of the left hemidiaphragm which is unchanged compared to the prior study.  The right lung is hyperexpanded with flattening of the right hemidiaphragm.  There is increased retrosternal air space and pruning of the pulmonary vasculature in the periphery, suggesting  underlying COPD. Thickening of the central bronchi is also noted.  No definite focal airspace consolidation.  No definite pleural effusions.  No evidence of edema.  Heart size is normal.  Mediastinal contours are unremarkable.  Atherosclerotic calcifications within the arch of the aorta.  A small calcified granuloma is again noted projecting over the lingula.  IMPRESSION: 1.  Chronic changes of the COPD redemonstrated, as above, without radiographic evidence of acute cardiopulmonary disease. 2.  Chronic elevation of the left hemidiaphragm is again noted. 3.  Old calcified granuloma in the lingula again noted.  Original Report Authenticated By: Florencia Reasons, M.D.     No diagnosis found. 1) COPD exacerbation    MDM  Pt with known asthma/COPD presents with shortness of breath which she states is consistent with previous COPD exacerbations. She denies any associated chest pain with this. Does admit some episodic CP which her PCP is aware of, and she is scheduled to see cards for this on Thurs. She has 2 negative troponins here. CXR shows changes c/w COPD but no new infiltrates or acute findings. Pt felt better and had improved air movement with one neb tx. Based on this, suspicion lowered for ACS. Will plan to tx as COPD exacerbation with burst pred. Pt advised of reasons to return to ED; given strict return precautions. She verbalized understanding and agreed to plan.  Plan d/w Dr. Rosalia Hammers who saw pt with me.        Grant Fontana, [place] 04/13/12 [phone]

## 2012-04-13 NOTE — ED Notes (Signed)
Pt states she had difficulty breathing last night while sleeping, states SOB got worse upon getting out of bed and moving around.

## 2012-04-13 NOTE — ED Notes (Signed)
RN called pt dtr to come transport pt home.

## 2012-04-13 NOTE — ED Notes (Signed)
Pt denies CP, states has an appt Thursday for cardiac stress test arranged by Dr Valentina Lucks. Test scheduled due to 2 episodes CP in past 2 months.

## 2012-04-13 NOTE — Discharge Instructions (Signed)
You have been treated today for an exacerbation of your COPD. Your labs appeared normal as compared with your old labs, and your chest xray did not show any new changes. You have been prescribed a short course of prednisone to treat your COPD. Take this as prescribed. It's very important that you keep your appointment on Thursday with Dr. Katrinka Blazing. If you develop chest pain, shortness of breath, nausea, vomiting, sweatiness, or any other worrisome symptoms, please return to the ER immediately.  Asthma, Acute Bronchospasm Your exam shows you have asthma, or acute bronchospasm that acts like asthma. Bronchospasm means your air passages become narrowed. These conditions are due to inflammation and airway spasm that cause narrowing of the bronchial tubes in the lungs. This causes you to have wheezing and shortness of breath. CAUSES  Respiratory infections and allergies most often bring on these attacks. Smoking, air pollution, cold air, emotional upsets, and vigorous exercise can also bring them on.  TREATMENT   Treatment is aimed at making the narrowed airways larger. Mild asthma/bronchospasm is usually controlled with inhaled medicines. Albuterol is a common medicine that you breathe in to open spastic or narrowed airways. Some trade names for albuterol are Ventolin or Proventil. Steroid medicine is also used to reduce the inflammation when an attack is moderate or severe. Antibiotics (medications used to kill germs) are only used if a bacterial infection is present.   If you are pregnant and need to use Albuterol (Ventolin or Proventil), you can expect the baby to move more than usual shortly after the medicine is used.  HOME CARE INSTRUCTIONS   Rest.   Drink plenty of liquids. This helps the mucus to remain thin and easily coughed up. Do not use caffeine or alcohol.   Do not smoke. Avoid being exposed to second-hand smoke.   You play a critical role in keeping yourself in good health. Avoid exposure  to things that cause you to wheeze. Avoid exposure to things that cause you to have breathing problems. Keep your medications up-to-date and available. Carefully follow your doctor's treatment plan.   When pollen or pollution is bad, keep windows closed and use an air conditioner go to places with air conditioning. If you are allergic to furry pets or birds, find new homes for them or keep them outside.   Take your medicine exactly as prescribed.   Asthma requires careful medical attention. See your caregiver for follow-up as advised. If you are more than [redacted] weeks pregnant and you were prescribed any new medications, let your Obstetrician know about the visit and how you are doing. Arrange a recheck.  SEEK IMMEDIATE MEDICAL CARE IF:   You are getting worse.   You have trouble breathing. If severe, call 911.   You develop chest pain or discomfort.   You are throwing up or not drinking fluids.   You are not getting better within 24 hours.   You are coughing up yellow, green, brown, or bloody sputum.   You develop a fever over 102 F (38.9 C).   You have trouble swallowing.  MAKE SURE YOU:   Understand these instructions.   Will watch your condition.   Will get help right away if you are not doing well or get worse.  Document Released: 03/19/2007 Document Revised: 11/21/2011 Document Reviewed: 11/16/2007 [facility] Patient Information 2012 [place], [place].

## 2012-04-16 NOTE — ED Provider Notes (Signed)
  I performed a history and physical examination of Ebony Peterson and discussed her management with Grant Fontana I agree with the history, physical, assessment, and plan of care, with the following exceptions: None  I was present for the following procedures: None Time Spent in Critical Care of the patient: None Time spent in discussions with the patient and family10 min Ebony Mcavoy Corlis Leak, MD 04/16/12 1752

## 2012-05-18 ENCOUNTER — Other Ambulatory Visit: Payer: Self-pay | Admitting: Internal Medicine

## 2012-05-19 ENCOUNTER — Other Ambulatory Visit: Payer: Self-pay | Admitting: Internal Medicine

## 2012-05-23 ENCOUNTER — Emergency Department (HOSPITAL_COMMUNITY): Payer: Medicare Other

## 2012-05-23 ENCOUNTER — Encounter (HOSPITAL_COMMUNITY): Payer: Self-pay | Admitting: Emergency Medicine

## 2012-05-23 ENCOUNTER — Inpatient Hospital Stay (HOSPITAL_COMMUNITY)
Admission: EM | Admit: 2012-05-23 | Discharge: 2012-05-26 | DRG: 191 | Disposition: A | Discharge status: Home / Self Care | Payer: Medicare Other | Attending: Internal Medicine | Admitting: Internal Medicine

## 2012-05-23 DIAGNOSIS — Z853 Personal history of malignant neoplasm of breast: Secondary | ICD-10-CM

## 2012-05-23 DIAGNOSIS — I129 Hypertensive chronic kidney disease with stage 1 through stage 4 chronic kidney disease, or unspecified chronic kidney disease: Secondary | ICD-10-CM | POA: Diagnosis present

## 2012-05-23 DIAGNOSIS — I10 Essential (primary) hypertension: Secondary | ICD-10-CM | POA: Diagnosis present

## 2012-05-23 DIAGNOSIS — N390 Urinary tract infection, site not specified: Secondary | ICD-10-CM | POA: Diagnosis present

## 2012-05-23 DIAGNOSIS — R079 Chest pain, unspecified: Secondary | ICD-10-CM | POA: Diagnosis present

## 2012-05-23 DIAGNOSIS — J45901 Unspecified asthma with (acute) exacerbation: Principal | ICD-10-CM | POA: Diagnosis present

## 2012-05-23 DIAGNOSIS — N183 Chronic kidney disease, stage 3 unspecified: Secondary | ICD-10-CM | POA: Diagnosis present

## 2012-05-23 DIAGNOSIS — J4521 Mild intermittent asthma with (acute) exacerbation: Secondary | ICD-10-CM | POA: Diagnosis present

## 2012-05-23 DIAGNOSIS — D649 Anemia, unspecified: Secondary | ICD-10-CM | POA: Diagnosis present

## 2012-05-23 DIAGNOSIS — J441 Chronic obstructive pulmonary disease with (acute) exacerbation: Principal | ICD-10-CM | POA: Diagnosis present

## 2012-05-23 DIAGNOSIS — E876 Hypokalemia: Secondary | ICD-10-CM | POA: Diagnosis present

## 2012-05-23 LAB — CBC
HCT: 30.6 % — ABNORMAL LOW (ref 36.0–46.0)
Hemoglobin: 10.5 g/dL — ABNORMAL LOW (ref 12.0–15.0)
MCHC: 34.3 g/dL (ref 30.0–36.0)
RDW: 12.7 % (ref 11.5–15.5)
WBC: 7.6 10*3/uL (ref 4.0–10.5)

## 2012-05-23 LAB — DIFFERENTIAL
Basophils Absolute: 0 10*3/uL (ref 0.0–0.1)
Lymphocytes Relative: 20 % (ref 12–46)
Monocytes Absolute: 0.7 10*3/uL (ref 0.1–1.0)
Monocytes Relative: 9 % (ref 3–12)
Neutro Abs: 4.3 10*3/uL (ref 1.7–7.7)

## 2012-05-23 LAB — TROPONIN I: Troponin I: <0.3 ng/mL (ref <0.30)

## 2012-05-23 LAB — BASIC METABOLIC PANEL
BUN: 24 mg/dL — ABNORMAL HIGH (ref 6–23)
CO2: 26 mEq/L (ref 19–32)
Chloride: 101 mEq/L (ref 96–112)
Creatinine, Ser: 1.32 mg/dL — ABNORMAL HIGH (ref 0.50–1.10)
Potassium: 3.2 mEq/L — ABNORMAL LOW (ref 3.5–5.1)

## 2012-05-23 MED ORDER — PREDNISONE 20 MG PO TABS
60.0000 mg | ORAL_TABLET | Freq: Once | ORAL | Status: AC
  Administered 2012-05-23: 60 mg via ORAL
  Filled 2012-05-23: qty 2
  Filled 2012-05-23: qty 1

## 2012-05-23 MED ORDER — POTASSIUM CHLORIDE CRYS ER 20 MEQ PO TBCR
40.0000 meq | EXTENDED_RELEASE_TABLET | Freq: Once | ORAL | Status: AC
  Administered 2012-05-24: 40 meq via ORAL
  Filled 2012-05-23: qty 2

## 2012-05-23 MED ORDER — ALBUTEROL (5 MG/ML) CONTINUOUS INHALATION SOLN
10.0000 mg/h | INHALATION_SOLUTION | Freq: Once | RESPIRATORY_TRACT | Status: AC
  Administered 2012-05-23: 10 mg/h via RESPIRATORY_TRACT

## 2012-05-23 NOTE — ED Notes (Signed)
Current and last previous EKG printed and given to EDP Jacobowitz for review.

## 2012-05-23 NOTE — ED Provider Notes (Signed)
History     CSN: [id number]  Arrival date & time 05/23/12  2040   First MD Initiated Contact with Patient 05/23/12 2215      Chief Complaint  Patient presents with  . Shortness of Breath    (Consider location/radiation/quality/duration/timing/severity/associated sxs/prior treatment) Patient is a 78 y.o. female presenting with shortness of breath. The history is provided by the patient. No language interpreter was used.  Shortness of Breath  The current episode started 2 days ago. The onset was gradual. The problem occurs continuously. The problem has been gradually worsening. The problem is moderate. The symptoms are relieved by nothing. The symptoms are aggravated by activity. Associated symptoms include shortness of breath. Pertinent negatives include no chest pain, no chest pressure, no orthopnea, no fever, no rhinorrhea, no sore throat, no cough and no wheezing. The cough has no precipitants. The cough is dry. There is no color change associated with the cough. Nothing relieves the cough. The cough is worsened by activity. She was not exposed to toxic fumes. She has not inhaled smoke recently. She has had intermittent steroid use. She has had prior hospitalizations. She has had no prior ICU admissions. She has had no prior intubations. Her past medical history is significant for asthma and past wheezing.    Past Medical History  Diagnosis Date  . Asthma   . Hypertension   . COPD (chronic obstructive pulmonary disease)   . Breast CA     No past surgical history on file.  Family History  Problem Relation Age of Onset  . Diabetes Sister   . Diabetes Sister     History  Substance Use Topics  . Smoking status: Never Smoker   . Smokeless tobacco: Never Used  . Alcohol Use: No    OB History    Grav Para Term Preterm Abortions TAB SAB Ect Mult Living                  Review of Systems  Constitutional: Negative for fever, chills, activity change, appetite change and  fatigue.  HENT: Negative for congestion, sore throat, rhinorrhea, neck pain and neck stiffness.   Respiratory: Positive for shortness of breath. Negative for cough, chest tightness and wheezing.   Cardiovascular: Negative for chest pain, palpitations, orthopnea and leg swelling.  Gastrointestinal: Negative for nausea, vomiting, abdominal pain, diarrhea and constipation.  Genitourinary: Negative for dysuria, urgency, frequency and flank pain.  Musculoskeletal: Negative for myalgias, back pain and arthralgias.  Neurological: Negative for dizziness, weakness, light-headedness, numbness and headaches.  All other systems reviewed and are negative.    Allergies  Review of patient's allergies indicates no known allergies.  Home Medications   Current Outpatient Rx  Name Route Sig Dispense Refill  . ALBUTEROL SULFATE HFA 108 (90 BASE) MCG/ACT IN AERS Inhalation Inhale 2 puffs into the lungs every 4 (four) hours as needed.     Marland Kitchen AMLODIPINE BESYLATE 5 MG PO TABS Oral Take 5 mg by mouth daily.    . ASPIRIN EC 81 MG PO TBEC Oral Take 81 mg by mouth daily.    Marland Kitchen FLUTICASONE-SALMETEROL 100-50 MCG/DOSE IN AEPB Inhalation Inhale 1 puff into the lungs every 12 (twelve) hours.      . OMEPRAZOLE 20 MG PO CPDR Oral Take 20 mg by mouth daily.    . OXYBUTYNIN CHLORIDE 5 MG PO TABS Oral Take 2.5 mg by mouth 3 (three) times daily.     Marland Kitchen POTASSIUM CHLORIDE CRYS ER 20 MEQ PO TBCR Oral Take  20 mEq by mouth 2 (two) times daily.      Marland Kitchen PREDNISONE 5 MG PO TABS Oral Take 5 mg by mouth every other day.    . TRIAMTERENE-HCTZ 37.5-25 MG PO TABS Oral Take 1 tablet by mouth daily.        BP 134/71  Pulse 81  Temp(Src) 99.3 F (37.4 C) (Oral)  Resp 20  SpO2 100%  Physical Exam  Nursing note and vitals reviewed. Constitutional: She is oriented to person, place, and time. She appears well-developed and well-nourished. No distress.  HENT:  Head: Normocephalic and atraumatic.  Mouth/Throat: Oropharynx is clear and  moist.  Eyes: Conjunctivae and EOM are normal. Pupils are equal, round, and reactive to light.  Neck: Normal range of motion. Neck supple.  Cardiovascular: Normal rate, regular rhythm, normal heart sounds and intact distal pulses.  Exam reveals no gallop and no friction rub.   No murmur heard. Pulmonary/Chest: No respiratory distress. She has no wheezes. She exhibits no tenderness.       Diffusely diminished breath sounds  Abdominal: Soft. Bowel sounds are normal. There is no tenderness. There is no rebound and no guarding.  Musculoskeletal: Normal range of motion. She exhibits edema (trace pedal edema). She exhibits no tenderness.  Neurological: She is alert and oriented to person, place, and time. No cranial nerve deficit.  Skin: Skin is warm and dry. No rash noted.    ED Course  Procedures (including critical care time)   Date: 05/23/2012  Rate: 79  Rhythm: normal sinus rhythm  QRS Axis: normal  Intervals: normal  ST/T Wave abnormalities: normal  Conduction Disutrbances:none  Narrative Interpretation:   Old EKG Reviewed: unchanged   Date: 05/24/2012  Rate: 107  Rhythm: sinus tachycardia  QRS Axis: normal  Intervals: normal  ST/T Wave abnormalities: normal  Conduction Disutrbances:first-degree A-V block   Narrative Interpretation:   Old EKG Reviewed: unchanged  Labs Reviewed  CBC - Abnormal; Notable for the following:    RBC 3.38 (*)    Hemoglobin 10.5 (*)    HCT 30.6 (*)    All other components within normal limits  DIFFERENTIAL - Abnormal; Notable for the following:    Eosinophils Relative 13 (*)    Eosinophils Absolute 1.0 (*)    All other components within normal limits  BASIC METABOLIC PANEL - Abnormal; Notable for the following:    Potassium 3.2 (*)    BUN 24 (*)    Creatinine, Ser 1.32 (*)    GFR calc non Af Amer 34 (*)    GFR calc Af Amer 39 (*)    All other components within normal limits  TROPONIN I  URINALYSIS, ROUTINE W REFLEX MICROSCOPIC   Dg  Chest 2 View  05/23/2012  *RADIOLOGY REPORT*  Clinical Data: Shortness of breath, wheezing  CHEST - 2 VIEW  Comparison: 04/13/2012  Findings: Elevated left hemidiaphragm.  Hyperinflation and coarse interstitial markings are similar to prior.  No focal consolidation.  No pleural effusion.  Lingular calcified granuloma again noted.  Osteopenia. Leftward thoracolumbar curvature.  No acute osseous finding is confirmed.  IMPRESSION: Changes of COPD.  No definite radiographic evidence for acute cardiopulmonary process.  Original Report Authenticated By: Waneta Martins, M.D.     1. COPD exacerbation   2. Chest pain       MDM  COPD exacerbation. The patient developed left-sided chest pain related to her jaw one emergency department. Repeat EKG is stable. She received aspirin and nitroglycerin as well as morphine  for pain. She is not have cardiology physician. She is improved from COPD standpoint actually got a continuous breathing treatment as well as prednisone. She'll require admission for further evaluation of her chest pain. Pulmonary embolus was considered although unlikely.        Dayton Bailiff, MD 05/24/12 [phone]

## 2012-05-23 NOTE — ED Notes (Addendum)
Pt c/o of chronic SOB that increased in severity on Thursday per her daughter. Pt is warm & dry, A&O, and in no apparent distress.

## 2012-05-23 NOTE — ED Notes (Signed)
Pt returned from radiology via stretcher. Pt in bed, resting quietly.

## 2012-05-23 NOTE — ED Notes (Signed)
[id number]<AV> Expected date:<BR> Expected time:<BR> Means of arrival:<BR> Comments:<BR> Sharene Skeans

## 2012-05-24 ENCOUNTER — Encounter (HOSPITAL_COMMUNITY): Payer: Self-pay | Admitting: Internal Medicine

## 2012-05-24 DIAGNOSIS — J45901 Unspecified asthma with (acute) exacerbation: Secondary | ICD-10-CM

## 2012-05-24 DIAGNOSIS — I517 Cardiomegaly: Secondary | ICD-10-CM

## 2012-05-24 DIAGNOSIS — D649 Anemia, unspecified: Secondary | ICD-10-CM | POA: Diagnosis present

## 2012-05-24 DIAGNOSIS — R079 Chest pain, unspecified: Secondary | ICD-10-CM

## 2012-05-24 DIAGNOSIS — I10 Essential (primary) hypertension: Secondary | ICD-10-CM

## 2012-05-24 LAB — BASIC METABOLIC PANEL
BUN: 24 mg/dL — ABNORMAL HIGH (ref 6–23)
GFR calc non Af Amer: 32 mL/min — ABNORMAL LOW (ref >90)
Glucose, Bld: 158 mg/dL — ABNORMAL HIGH (ref 70–99)
Potassium: 2.8 mEq/L — ABNORMAL LOW (ref 3.5–5.1)

## 2012-05-24 LAB — CBC
HCT: 30.4 % — ABNORMAL LOW (ref 36.0–46.0)
Hemoglobin: 10.2 g/dL — ABNORMAL LOW (ref 12.0–15.0)
MCH: 30.7 pg (ref 26.0–34.0)
MCHC: 33.6 g/dL (ref 30.0–36.0)

## 2012-05-24 LAB — URINALYSIS, ROUTINE W REFLEX MICROSCOPIC
Glucose, UA: 100 mg/dL — AB
Ketones, ur: NEGATIVE mg/dL
Protein, ur: NEGATIVE mg/dL

## 2012-05-24 LAB — CARDIAC PANEL(CRET KIN+CKTOT+MB+TROPI)
CK, MB: 3.1 ng/mL (ref 0.3–4.0)
CK, MB: 3.6 ng/mL (ref 0.3–4.0)
Relative Index: INVALID (ref 0.0–2.5)
Troponin I: <0.3 ng/mL (ref <0.30)
Troponin I: <0.3 ng/mL (ref <0.30)

## 2012-05-24 LAB — URINE MICROSCOPIC-ADD ON

## 2012-05-24 MED ORDER — ALBUTEROL SULFATE (5 MG/ML) 0.5% IN NEBU
2.5000 mg | INHALATION_SOLUTION | RESPIRATORY_TRACT | Status: DC | PRN
  Filled 2012-05-24: qty 0.5

## 2012-05-24 MED ORDER — PANTOPRAZOLE SODIUM 40 MG PO TBEC
40.0000 mg | DELAYED_RELEASE_TABLET | Freq: Every day | ORAL | Status: DC
  Administered 2012-05-24 – 2012-05-25 (×2): 40 mg via ORAL
  Filled 2012-05-24 (×3): qty 1

## 2012-05-24 MED ORDER — LEVALBUTEROL HCL 0.63 MG/3ML IN NEBU
0.6300 mg | INHALATION_SOLUTION | Freq: Four times a day (QID) | RESPIRATORY_TRACT | Status: DC | PRN
  Filled 2012-05-24: qty 3

## 2012-05-24 MED ORDER — TRIAMTERENE-HCTZ 37.5-25 MG PO TABS
1.0000 | ORAL_TABLET | Freq: Every day | ORAL | Status: DC
  Administered 2012-05-24: 1 via ORAL
  Filled 2012-05-24: qty 1

## 2012-05-24 MED ORDER — DIPHENHYDRAMINE HCL 25 MG PO CAPS
25.0000 mg | ORAL_CAPSULE | Freq: Every evening | ORAL | Status: DC | PRN
  Administered 2012-05-24: 25 mg via ORAL
  Filled 2012-05-24: qty 1

## 2012-05-24 MED ORDER — BUDESONIDE 0.5 MG/2ML IN SUSP
0.5000 mg | Freq: Two times a day (BID) | RESPIRATORY_TRACT | Status: DC
  Administered 2012-05-24 (×2): 0.5 mg via RESPIRATORY_TRACT
  Filled 2012-05-24 (×5): qty 2

## 2012-05-24 MED ORDER — ONDANSETRON HCL 4 MG/2ML IJ SOLN: 4.0000 mg | Freq: Four times a day (QID) | INTRAMUSCULAR | Status: DC | PRN

## 2012-05-24 MED ORDER — ACETAMINOPHEN 650 MG RE SUPP: 650.0000 mg | Freq: Four times a day (QID) | RECTAL | Status: DC | PRN

## 2012-05-24 MED ORDER — OXYBUTYNIN CHLORIDE 5 MG PO TABS
2.5000 mg | ORAL_TABLET | Freq: Three times a day (TID) | ORAL | Status: DC
  Administered 2012-05-24: 2.5 mg via ORAL
  Administered 2012-05-24: 22:00:00 via ORAL
  Administered 2012-05-24 – 2012-05-26 (×5): 2.5 mg via ORAL
  Filled 2012-05-24 (×9): qty 0.5

## 2012-05-24 MED ORDER — ASPIRIN 81 MG PO CHEW
324.0000 mg | CHEWABLE_TABLET | Freq: Once | ORAL | Status: AC
  Administered 2012-05-24: 324 mg via ORAL
  Filled 2012-05-24: qty 4

## 2012-05-24 MED ORDER — ALBUTEROL SULFATE (5 MG/ML) 0.5% IN NEBU: 2.5000 mg | INHALATION_SOLUTION | RESPIRATORY_TRACT | Status: DC | PRN

## 2012-05-24 MED ORDER — PNEUMOCOCCAL 13-VAL CONJ VACC IM SUSP
0.5000 mL | INTRAMUSCULAR | Status: AC
  Administered 2012-05-25: 0.5 mL via INTRAMUSCULAR
  Filled 2012-05-24: qty 0.5

## 2012-05-24 MED ORDER — POTASSIUM CHLORIDE CRYS ER 20 MEQ PO TBCR
20.0000 meq | EXTENDED_RELEASE_TABLET | Freq: Three times a day (TID) | ORAL | Status: DC
  Administered 2012-05-24 (×3): 20 meq via ORAL
  Filled 2012-05-24 (×6): qty 1

## 2012-05-24 MED ORDER — MAGNESIUM OXIDE 400 (241.3 MG) MG PO TABS
400.0000 mg | ORAL_TABLET | Freq: Once | ORAL | Status: AC
  Administered 2012-05-24: 400 mg via ORAL
  Filled 2012-05-24: qty 1

## 2012-05-24 MED ORDER — ALBUTEROL SULFATE (5 MG/ML) 0.5% IN NEBU
2.5000 mg | INHALATION_SOLUTION | Freq: Four times a day (QID) | RESPIRATORY_TRACT | Status: DC
  Administered 2012-05-24 (×4): 2.5 mg via RESPIRATORY_TRACT
  Filled 2012-05-24 (×4): qty 0.5

## 2012-05-24 MED ORDER — SODIUM CHLORIDE 0.9 % IJ SOLN
3.0000 mL | Freq: Two times a day (BID) | INTRAMUSCULAR | Status: DC
  Administered 2012-05-24 – 2012-05-25 (×6): 3 mL via INTRAVENOUS

## 2012-05-24 MED ORDER — POTASSIUM CHLORIDE CRYS ER 20 MEQ PO TBCR
20.0000 meq | EXTENDED_RELEASE_TABLET | Freq: Two times a day (BID) | ORAL | Status: DC
  Administered 2012-05-24 (×2): 20 meq via ORAL
  Filled 2012-05-24 (×4): qty 1

## 2012-05-24 MED ORDER — ONDANSETRON HCL 4 MG PO TABS: 4.0000 mg | ORAL_TABLET | Freq: Four times a day (QID) | ORAL | Status: DC | PRN

## 2012-05-24 MED ORDER — ONDANSETRON HCL 4 MG/2ML IJ SOLN: 4.0000 mg | Freq: Three times a day (TID) | INTRAMUSCULAR | Status: DC | PRN

## 2012-05-24 MED ORDER — POTASSIUM CHLORIDE CRYS ER 20 MEQ PO TBCR: 20.0000 meq | EXTENDED_RELEASE_TABLET | Freq: Two times a day (BID) | ORAL | Status: DC

## 2012-05-24 MED ORDER — POTASSIUM CHLORIDE CRYS ER 20 MEQ PO TBCR
40.0000 meq | EXTENDED_RELEASE_TABLET | Freq: Once | ORAL | Status: AC
  Administered 2012-05-24: 40 meq via ORAL
  Filled 2012-05-24: qty 2

## 2012-05-24 MED ORDER — SODIUM CHLORIDE 0.9 % IJ SOLN
3.0000 mL | Freq: Two times a day (BID) | INTRAMUSCULAR | Status: DC
  Administered 2012-05-24: 3 mL via INTRAVENOUS

## 2012-05-24 MED ORDER — PREDNISONE 5 MG PO TABS
5.0000 mg | ORAL_TABLET | ORAL | Status: DC
Start: 2012-05-24 — End: 2012-05-26
  Administered 2012-05-24 – 2012-05-26 (×2): 5 mg via ORAL
  Filled 2012-05-24 (×2): qty 1

## 2012-05-24 MED ORDER — ACETAMINOPHEN 325 MG PO TABS: 650.0000 mg | ORAL_TABLET | Freq: Four times a day (QID) | ORAL | Status: DC | PRN

## 2012-05-24 MED ORDER — NITROGLYCERIN 0.4 MG SL SUBL
0.4000 mg | SUBLINGUAL_TABLET | SUBLINGUAL | Status: DC | PRN
  Administered 2012-05-24 (×2): 0.4 mg via SUBLINGUAL
  Filled 2012-05-24 (×2): qty 25

## 2012-05-24 MED ORDER — MORPHINE SULFATE 4 MG/ML IJ SOLN
4.0000 mg | Freq: Once | INTRAMUSCULAR | Status: AC
  Administered 2012-05-24: 4 mg via INTRAVENOUS
  Filled 2012-05-24: qty 1

## 2012-05-24 MED ORDER — ASPIRIN EC 325 MG PO TBEC
325.0000 mg | DELAYED_RELEASE_TABLET | Freq: Every day | ORAL | Status: DC
  Administered 2012-05-24 – 2012-05-26 (×3): 325 mg via ORAL
  Filled 2012-05-24 (×3): qty 1

## 2012-05-24 MED ORDER — TRIAMTERENE-HCTZ 37.5-25 MG PO TABS
0.5000 | ORAL_TABLET | Freq: Every day | ORAL | Status: DC
  Filled 2012-05-24: qty 0.5

## 2012-05-24 MED ORDER — AMLODIPINE BESYLATE 5 MG PO TABS
5.0000 mg | ORAL_TABLET | Freq: Every day | ORAL | Status: DC
  Administered 2012-05-24 – 2012-05-26 (×3): 5 mg via ORAL
  Filled 2012-05-24 (×3): qty 1

## 2012-05-24 NOTE — ED Notes (Signed)
Pt refused In/Out cath at this time. Requested "more time be given for fluids to run". RN notified. Will continue to monitor.

## 2012-05-24 NOTE — ED Notes (Signed)
Pt c/o severe chest pain (10/10) that radiates to the left jaw and ear. Pt in apparent distress. MD at bedside when pt reported symptoms.

## 2012-05-24 NOTE — Progress Notes (Signed)
Subjective: Ebony Peterson is feeling better today.  Her potassium is 2.8.  Not currently wheezing.  She does feel weak, but is ambulatory  Objective: Weight change:   Intake/Output Summary (Last 24 hours) at 05/24/12 1001 Last data filed at 05/24/12 0720  Gross per 24 hour  Intake    600 ml  Output    350 ml  Net    250 ml   Filed Vitals:   05/24/12 0258 05/24/12 0324 05/24/12 0604 05/24/12 0815  BP: 140/64  119/57   Pulse: 81  81   Temp: 97.8 F (36.6 C)  97.6 F (36.4 C)   TempSrc: Oral  Oral   Resp: 20  20   Height: 4\' 11"  (1.499 m)     Weight: 46.584 kg (102 lb 11.2 oz)     SpO2: 97% 98% 98% 98%    Physical Exam  Constitutional: She is oriented to place.  Conversive.  Not short of breath  HEENT: Nonacute Neck: Normal range of motion. Neck supple.  Cardiovascular: Normal rate and regular rhythm.  Respiratory: Effort normal and breath sounds normal. No respiratory distress. She has no wheezes. She has no rales.  GI: Soft. Bowel sounds are normal. She exhibits no distension. There is no tenderness. There is no rebound.  Musculoskeletal: Normal range of motion. She exhibits no edema and no tenderness.  Neurological: She is alert. Nonfocal exam.  Moderately weak diffusely Skin: Skin is warm and dry. No rash noted. She is not diaphoretic. No erythema.  Psychiatric: Her behavior is normal.   Lab Results:  Basename 05/24/12 0326 05/23/12 2240  NA 138 139  K 2.8* 3.2*  CL 99 101  CO2 25 26  GLUCOSE 158* 79  BUN 24* 24*  CREATININE 1.40* 1.32*  CALCIUM 8.7 8.7  MG -- --  PHOS -- --   No results found for this basename: AST:2,ALT:2,ALKPHOS:2,BILITOT:2,PROT:2,ALBUMIN:2 in the last 72 hours No results found for this basename: LIPASE:2,AMYLASE:2 in the last 72 hours  Basename 05/24/12 0326 05/23/12 2240  WBC 8.3 7.6  NEUTROABS -- 4.3  HGB 10.2* 10.5*  HCT 30.4* 30.6*  MCV 91.6 90.5  PLT 268 258    Basename 05/24/12 0326 05/23/12 2240  CKTOTAL 94 --  CKMB 2.9 --    CKMBINDEX -- --  TROPONINI <0.30 <0.30   No components found with this basename: POCBNP:3 No results found for this basename: DDIMER:2 in the last 72 hours No results found for this basename: HGBA1C:2 in the last 72 hours No results found for this basename: CHOL:2,HDL:2,LDLCALC:2,TRIG:2,CHOLHDL:2,LDLDIRECT:2 in the last 72 hours No results found for this basename: TSH,T4TOTAL,FREET3,T3FREE,THYROIDAB in the last 72 hours No results found for this basename: VITAMINB12:2,FOLATE:2,FERRITIN:2,TIBC:2,IRON:2,RETICCTPCT:2 in the last 72 hours  Studies/Results: Dg Chest 2 View  05/23/2012  *RADIOLOGY REPORT*  Clinical Data: Shortness of breath, wheezing  CHEST - 2 VIEW  Comparison: 04/13/2012  Findings: Elevated left hemidiaphragm.  Hyperinflation and coarse interstitial markings are similar to prior.  No focal consolidation.  No pleural effusion.  Lingular calcified granuloma again noted.  Osteopenia. Leftward thoracolumbar curvature.  No acute osseous finding is confirmed.  IMPRESSION: Changes of COPD.  No definite radiographic evidence for acute cardiopulmonary process.  Original Report Authenticated By: Waneta Martins, M.D.   Medications: Scheduled Meds:   . albuterol  2.5 mg Nebulization Q6H  . albuterol  10 mg/hr Nebulization Once  . amLODipine  5 mg Oral Daily  . aspirin  324 mg Oral Once  . aspirin EC  325  mg Oral Daily  . budesonide  0.5 mg Nebulization BID  .  morphine injection  4 mg Intravenous Once  . oxybutynin  2.5 mg Oral TID  . pantoprazole  40 mg Oral Q1200  . pneumococcal 13-valent conjugate vaccine  0.5 mL Intramuscular Tomorrow-1000  . potassium chloride SA  20 mEq Oral BID  . potassium chloride  40 mEq Oral Once  . potassium chloride  40 mEq Oral Once  . predniSONE  5 mg Oral QODAY  . predniSONE  60 mg Oral Once  . sodium chloride  3 mL Intravenous Q12H  . sodium chloride  3 mL Intravenous Q12H  . triamterene-hydrochlorothiazide  1 each Oral Daily  . DISCONTD:  potassium chloride  20 mEq Oral BID   Continuous Infusions:  PRN Meds:.acetaminophen, acetaminophen, albuterol, nitroGLYCERIN, ondansetron (ZOFRAN) IV, ondansetron, DISCONTD: albuterol, DISCONTD: ondansetron (ZOFRAN) IV  Assessment/Plan:  #1. Asthma exacerbation - patient is on chronic steroids which will be continued. Continue Pulmicort and low-dose Xopenex when necessary  #2. Chest pain - cycle cardiac markers. Aspirin. Nitroglycerin when necessary. Check 2-D echo.  2-D echo report pending.  Cardiac enzymes negative #3. Hypertension - reduce Maxzide to half dose daily.  She reports some dislike of some of her generic medications thinking that they don't work quite as well #4. Anemia normocytic normochromic - follow CBC. If no significant fall in hemoglobin then further workup as outpatient.  #5. Possible CKD. #6. Hypokalemia - being replaced - check basic metabolic profile in a.m. #7. Disposition - possible discharge in a.m. CODE STATUS - Full code.      LOS: 1 day   Naoko Diperna NEVILL 05/24/2012, 10:01 AM

## 2012-05-24 NOTE — Progress Notes (Signed)
Dr. Kevan Ny ordered K+ stat one time for patients low K+ value. He will come in today to round on pt.

## 2012-05-24 NOTE — H&P (Signed)
Ebony Peterson is an 78 y.o. female.   PCP - Dr.John Valentina Lucks. Chief Complaint: Shortness of breath. HPI: 78 year-old female with known history of asthma, hypertension has been experiencing shortness of breath off and on for last few days which worsened last 24 hours and decided to come to the ER. In the ER patient was noticed to have wheezing and was given nebulizer treatments. Chest x-ray did not show anything acute. While in the ER patient started with a chest pressure radiating to her neck. The pressure is improved at this time. Cardiac enzymes and EKG does not show anything acute patient will be admitted for further observation and management. Patient denies any nausea vomiting abdominal pain palpitations dizziness.  Past Medical History  Diagnosis Date  . Asthma   . Hypertension   . COPD (chronic obstructive pulmonary disease)   . Breast CA     History reviewed. No pertinent past surgical history.  Family History  Problem Relation Age of Onset  . Diabetes Sister   . Diabetes Sister    Social History:  reports that she has never smoked. She has never used smokeless tobacco. She reports that she does not drink alcohol or use illicit drugs.  Allergies: No Known Allergies   (Not in a hospital admission)  Results for orders placed during the hospital encounter of 05/23/12 (from the past 48 hour(s))  CBC     Status: Abnormal   Collection Time   05/23/12 10:40 PM      Component Value Range Comment   WBC 7.6  4.0 - 10.5 (K/uL)    RBC 3.38 (*) 3.87 - 5.11 (MIL/uL)    Hemoglobin 10.5 (*) 12.0 - 15.0 (g/dL)    HCT 45.4 (*) 09.8 - 46.0 (%)    MCV 90.5  78.0 - 100.0 (fL)    MCH 31.1  26.0 - 34.0 (pg)    MCHC 34.3  30.0 - 36.0 (g/dL)    RDW 11.9  14.7 - 82.9 (%)    Platelets 258  150 - 400 (K/uL)   DIFFERENTIAL     Status: Abnormal   Collection Time   05/23/12 10:40 PM      Component Value Range Comment   Neutrophils Relative 57  43 - 77 (%)    Neutro Abs 4.3  1.7 - 7.7 (K/uL)    Lymphocytes Relative 20  12 - 46 (%)    Lymphs Abs 1.5  0.7 - 4.0 (K/uL)    Monocytes Relative 9  3 - 12 (%)    Monocytes Absolute 0.7  0.1 - 1.0 (K/uL)    Eosinophils Relative 13 (*) 0 - 5 (%)    Eosinophils Absolute 1.0 (*) 0.0 - 0.7 (K/uL)    Basophils Relative 0  0 - 1 (%)    Basophils Absolute 0.0  0.0 - 0.1 (K/uL)   BASIC METABOLIC PANEL     Status: Abnormal   Collection Time   05/23/12 10:40 PM      Component Value Range Comment   Sodium 139  135 - 145 (mEq/L)    Potassium 3.2 (*) 3.5 - 5.1 (mEq/L)    Chloride 101  96 - 112 (mEq/L)    CO2 26  19 - 32 (mEq/L)    Glucose, Bld 79  70 - 99 (mg/dL)    BUN 24 (*) 6 - 23 (mg/dL)    Creatinine, Ser 5.62 (*) 0.50 - 1.10 (mg/dL)    Calcium 8.7  8.4 - 10.5 (mg/dL)  GFR calc non Af Amer 34 (*) >90 (mL/min)    GFR calc Af Amer 39 (*) >90 (mL/min)   TROPONIN I     Status: Normal   Collection Time   05/23/12 10:40 PM      Component Value Range Comment   Troponin I <0.30  <0.30 (ng/mL)    Dg Chest 2 View  05/23/2012  *RADIOLOGY REPORT*  Clinical Data: Shortness of breath, wheezing  CHEST - 2 VIEW  Comparison: 04/13/2012  Findings: Elevated left hemidiaphragm.  Hyperinflation and coarse interstitial markings are similar to prior.  No focal consolidation.  No pleural effusion.  Lingular calcified granuloma again noted.  Osteopenia. Leftward thoracolumbar curvature.  No acute osseous finding is confirmed.  IMPRESSION: Changes of COPD.  No definite radiographic evidence for acute cardiopulmonary process.  Original Report Authenticated By: Waneta Martins, M.D.    Review of Systems  Constitutional: Negative.   HENT: Negative.   Eyes: Negative.   Respiratory: Positive for shortness of breath.   Cardiovascular: Positive for chest pain.  Gastrointestinal: Negative.   Genitourinary: Negative.   Musculoskeletal: Negative.   Skin: Negative.   Neurological: Negative.   Endo/Heme/Allergies: Negative.   Psychiatric/Behavioral: Negative.      Blood pressure 134/71, pulse 81, temperature 99.3 F (37.4 C), temperature source Oral, resp. rate 20, SpO2 100.00%. Physical Exam  Constitutional: She is oriented to person, place, and time. She appears well-developed and well-nourished. No distress.  HENT:  Head: Normocephalic.  Right Ear: External ear normal.  Left Ear: External ear normal.  Nose: Nose normal.  Mouth/Throat: Oropharynx is clear and moist. No oropharyngeal exudate.  Eyes: Conjunctivae are normal. Pupils are equal, round, and reactive to light. Right eye exhibits no discharge. Left eye exhibits no discharge. No scleral icterus.  Neck: Normal range of motion. Neck supple.  Cardiovascular: Normal rate and regular rhythm.   Respiratory: Effort normal and breath sounds normal. No respiratory distress. She has no wheezes. She has no rales.  GI: Soft. Bowel sounds are normal. She exhibits no distension. There is no tenderness. There is no rebound.  Musculoskeletal: Normal range of motion. She exhibits no edema and no tenderness.  Neurological: She is alert and oriented to person, place, and time.       Moves all extremities.  Skin: Skin is warm and dry. No rash noted. She is not diaphoretic. No erythema.  Psychiatric: Her behavior is normal.     Assessment/Plan #1. Asthma exacerbation - patient is on chronic steroids which will be continued. Continue Pulmicort and nebulizers. #2. Chest pain - cycle cardiac markers. Aspirin. Nitroglycerin when necessary. Check 2-D echo. #3. Hypertension - continue present medications. #4. Anemia normocytic normochromic - follow CBC. If no significant fall in hemoglobin then further workup as outpatient. #5. Possible CKD.  CODE STATUS - full code. Patient admitted under Dr. Kirby Funk service.  Tito Ausmus N. 05/24/2012, 1:37 AM

## 2012-05-24 NOTE — Progress Notes (Signed)
  Echocardiogram 2D Echocardiogram has been performed.  Ebony Peterson [facility] 05/24/2012, 8:54 AM

## 2012-05-24 NOTE — Progress Notes (Signed)
Pt c/o of cramps in left hand and both legs. MD notified. New order given for 1 time dose of magnesium. Vwilliams,rn.

## 2012-05-25 LAB — BASIC METABOLIC PANEL
CO2: 24 mEq/L (ref 19–32)
Chloride: 101 mEq/L (ref 96–112)
Sodium: 134 mEq/L — ABNORMAL LOW (ref 135–145)

## 2012-05-25 MED ORDER — CIPROFLOXACIN HCL 250 MG PO TABS
250.0000 mg | ORAL_TABLET | Freq: Two times a day (BID) | ORAL | Status: DC
  Administered 2012-05-25 – 2012-05-26 (×3): 250 mg via ORAL
  Filled 2012-05-25 (×5): qty 1

## 2012-05-25 MED ORDER — ALBUTEROL SULFATE (5 MG/ML) 0.5% IN NEBU: 2.5000 mg | INHALATION_SOLUTION | RESPIRATORY_TRACT | Status: DC | PRN

## 2012-05-25 MED ORDER — FLUTICASONE-SALMETEROL 250-50 MCG/DOSE IN AEPB
1.0000 | INHALATION_SPRAY | Freq: Two times a day (BID) | RESPIRATORY_TRACT | Status: DC
  Administered 2012-05-25 (×2): 1 via RESPIRATORY_TRACT
  Filled 2012-05-25: qty 14

## 2012-05-25 NOTE — Progress Notes (Signed)
Subjective: No shortness of breath.  No further chest pain  Objective: Vital signs in last 24 hours: Temp:  [98.6 F (37 C)-98.7 F (37.1 C)] 98.6 F (37 C) (06/10 0454) Pulse Rate:  [88-94] 88  (06/10 0454) Resp:  [19-22] 19  (06/10 0454) BP: (110-154)/(54-77) 154/77 mmHg (06/10 0454) SpO2:  [93 %-99 %] 99 % (06/10 0454) Weight change:  Last BM Date: 05/23/12  Intake/Output from previous day: 06/09 0701 - 06/10 0700 In: 1200 [P.O.:1200] Out: 750 [Urine:750] Intake/Output this shift: Total I/O In: 480 [P.O.:480] Out: 600 [Urine:600]  General appearance: alert and cooperative Resp: clear to auscultation bilaterally Cardio: regular rate and rhythm, S1, S2 normal, no murmur, click, rub or gallop GI: soft, non-tender; bowel sounds normal; no masses,  no organomegaly Extremities: extremities normal, atraumatic, no cyanosis or edema  Lab Results:  Basename 05/24/12 0326 05/23/12 2240  WBC 8.3 7.6  HGB 10.2* 10.5*  HCT 30.4* 30.6*  PLT 268 258   BMET  Basename 05/24/12 0326 05/23/12 2240  NA 138 139  K 2.8* 3.2*  CL 99 101  CO2 25 26  GLUCOSE 158* 79  BUN 24* 24*  CREATININE 1.40* 1.32*  CALCIUM 8.7 8.7    Studies/Results: Dg Chest 2 View  05/23/2012  *RADIOLOGY REPORT*  Clinical Data: Shortness of breath, wheezing  CHEST - 2 VIEW  Comparison: 04/13/2012  Findings: Elevated left hemidiaphragm.  Hyperinflation and coarse interstitial markings are similar to prior.  No focal consolidation.  No pleural effusion.  Lingular calcified granuloma again noted.  Osteopenia. Leftward thoracolumbar curvature.  No acute osseous finding is confirmed.  IMPRESSION: Changes of COPD.  No definite radiographic evidence for acute cardiopulmonary process.  Original Report Authenticated By: Waneta Martins, M.D.    Medications: I have reviewed the patient's current medications.  Assessment/Plan: Principal Problem:  *Exacerbation of intermittent asthma  feeling better, change to  advair, albuterol prn Active Problems:  HYPERTENSION controlled, no diuretic for now  Chest pain  One episode, cardiac enzymes negative, no further workup unless recurs    Anemia stable  UTI  cipro for 3 days, urine culture  Hypokalemia  Replete  Home in am.  Ambulate today   LOS: 2 days   Ebony Peterson JOSEPH 05/25/2012, 6:27 AM

## 2012-05-25 NOTE — Progress Notes (Signed)
   CARE MANAGEMENT NOTE 05/25/2012  Patient:  Ebony Peterson, Ebony Peterson   Account Number:  [id number]  Date Initiated:  05/25/2012  Documentation initiated by:  Jiles Crocker  Subjective/Objective Assessment:   ADMITTED WITH Asthma exacerbation     Action/Plan:   PCP - Dr.John Valentina Lucks. LIVES AT HOME; AWAITING ON PT/OT EVALS FOR ANY HHC NEEDS;   Anticipated DC Date:  06/01/2012   Anticipated DC Plan:  HOME W HOME HEALTH SERVICES      DC Planning Services  CM consult              Status of service:  In process, will continue to follow Medicare Important Message given?  NA - LOS <3 / Initial given by admissions (If response is "NO", the following Medicare IM given date fields will be blank)  Per UR Regulation:  Reviewed for med. necessity/level of care/duration of stay   Comments:  05/25/2012- B Kitiara Hintze RN, BSN, MHA

## 2012-05-26 LAB — BASIC METABOLIC PANEL
CO2: 28 mEq/L (ref 19–32)
Calcium: 8.8 mg/dL (ref 8.4–10.5)
GFR calc non Af Amer: 28 mL/min — ABNORMAL LOW (ref >90)
Glucose, Bld: 85 mg/dL (ref 70–99)
Potassium: 4.6 mEq/L (ref 3.5–5.1)
Sodium: 139 mEq/L (ref 135–145)

## 2012-05-26 MED ORDER — CIPROFLOXACIN HCL 250 MG PO TABS: 250.0000 mg | ORAL_TABLET | Freq: Two times a day (BID) | ORAL | Status: AC

## 2012-05-26 MED ORDER — FLUTICASONE-SALMETEROL 250-50 MCG/DOSE IN AEPB: 1.0000 | INHALATION_SPRAY | Freq: Two times a day (BID) | RESPIRATORY_TRACT | Status: DC

## 2012-05-26 NOTE — Discharge Summary (Signed)
Physician Discharge Summary  Patient ID: Ebony Peterson MRN: [id number] DOB/AGE: 03-11-1918 78 y.o.  Admit date: 05/23/2012 Discharge date: 05/26/2012  Admission Diagnoses: Exacerbation of asthma Hypertension Chest pain Chronic kidney disease stage III Hypokalemia  Discharge Diagnoses:  Principal Problem:  *Exacerbation of intermittent asthma Active Problems:  HYPERTENSION Chronic kidney disease stage III  Chest pain  Anemia UTI Hypokalemia   Discharged Condition: good  Hospital Course: The patient was admitted on 05/24/2012 complaining of shortness of breath on-and-off for a few days prior to admission. In the ER she was having some wheezing and was given nebulized treatment with improvement. Chest x-ray did not show any acute changes. The patient did suffer from some chest pressure radiating into her neck or any time in the ER. She was there for a minute to rule out an MI and treat her asthma exacerbation. Cardiac enzymes remained negative and she had no further chest pain during admission. No further cardiac workup was pursued. She was treated with Advair and bronchodilators nebulized with the admission and her shortness of breath quickly improved and she was at baseline at discharge. She did admit that she was not taking her Advair regularly at home and was instructed to do so. She did have a UTI and was treated with 3 days of Cipro for very mild symptoms. She has significant hypokalemia with potassium below 3 which was repleted and at discharge potassium was 4.6. Her diuretic was discontinued and potassium will be discontinued after discharge. She will remain on amlodipine for blood pressure which was well-controlled.  CODE STATUS full code Diet low-sodium diet Activity as tolerated  Consults: None  Significant Diagnostic Studies: labs: At discharge BUN 28 crit 1.56 potassium 4.6 and hemoglobin 10.2 which is chronic radiology: CXR: air trapping/emphysema  Treatments: antibiotics:  Cipro, respiratory therapy: O2 and steroids and bronchodilators  Discharge Exam: Blood pressure 119/52, pulse 64, temperature 98 F (36.7 C), temperature source Oral, resp. rate 16, height 4\' 11"  (1.499 m), weight 46.584 kg (102 lb 11.2 oz), SpO2 100.00%. General appearance: alert and cooperative Resp: clear to auscultation bilaterally Cardio: regular rate and rhythm, S1, S2 normal, no murmur, click, rub or gallop  Disposition: 01-Home or Self Care   Medication List  As of 05/26/2012  6:46 AM   STOP taking these medications         ADVAIR DISKUS 100-50 MCG/DOSE Aepb      KLOR-CON M20 20 MEQ tablet      triamterene-hydrochlorothiazide 37.5-25 MG per tablet         TAKE these medications         amLODipine 5 MG tablet   Commonly known as: NORVASC   Take 5 mg by mouth daily.      aspirin EC 81 MG tablet   Take 81 mg by mouth daily.      ciprofloxacin 250 MG tablet   Commonly known as: CIPRO   Take 1 tablet (250 mg total) by mouth 2 (two) times daily.      Fluticasone-Salmeterol 250-50 MCG/DOSE Aepb   Commonly known as: ADVAIR   Inhale 1 puff into the lungs 2 (two) times daily.      omeprazole 20 MG capsule   Commonly known as: PRILOSEC   Take 20 mg by mouth daily.      oxybutynin 5 MG tablet   Commonly known as: DITROPAN   Take 2.5 mg by mouth 3 (three) times daily.      predniSONE 5 MG tablet  Commonly known as: DELTASONE   Take 5 mg by mouth every other day.      PROAIR HFA 108 (90 BASE) MCG/ACT inhaler   Generic drug: albuterol   Inhale 2 puffs into the lungs every 4 (four) hours as needed.           Follow-up Information    Follow up with Lillia Mountain, MD in 10 days.   Contact information:   [address], [address] [employer] [place], [place]. [place] [address] [phone]          Signed: Lillia Mountain 05/26/2012, 6:46 AM

## 2012-05-26 NOTE — Discharge Instructions (Signed)
Take the Advair every day. Bring all of your medications to your next visit with me.

## 2012-10-19 ENCOUNTER — Other Ambulatory Visit: Payer: Self-pay | Admitting: Internal Medicine

## 2012-10-19 DIAGNOSIS — Z1231 Encounter for screening mammogram for malignant neoplasm of breast: Secondary | ICD-10-CM

## 2012-12-01 ENCOUNTER — Ambulatory Visit
Admission: RE | Admit: 2012-12-01 | Discharge: 2012-12-01 | Disposition: A | Discharge status: Home / Self Care | Payer: Medicare Other | Source: Ambulatory Visit | Attending: Internal Medicine | Admitting: Internal Medicine

## 2012-12-01 DIAGNOSIS — Z1231 Encounter for screening mammogram for malignant neoplasm of breast: Secondary | ICD-10-CM

## 2012-12-02 ENCOUNTER — Emergency Department (HOSPITAL_COMMUNITY)
Admission: EM | Admit: 2012-12-02 | Discharge: 2012-12-02 | Disposition: A | Discharge status: Home / Self Care | Payer: Medicare Other | Attending: Emergency Medicine | Admitting: Emergency Medicine

## 2012-12-02 ENCOUNTER — Encounter (HOSPITAL_COMMUNITY): Payer: Self-pay | Admitting: *Deleted

## 2012-12-02 ENCOUNTER — Emergency Department (HOSPITAL_COMMUNITY): Payer: Medicare Other

## 2012-12-02 DIAGNOSIS — R0989 Other specified symptoms and signs involving the circulatory and respiratory systems: Secondary | ICD-10-CM | POA: Insufficient documentation

## 2012-12-02 DIAGNOSIS — C50919 Malignant neoplasm of unspecified site of unspecified female breast: Secondary | ICD-10-CM | POA: Insufficient documentation

## 2012-12-02 DIAGNOSIS — M79609 Pain in unspecified limb: Secondary | ICD-10-CM

## 2012-12-02 DIAGNOSIS — R252 Cramp and spasm: Secondary | ICD-10-CM | POA: Insufficient documentation

## 2012-12-02 DIAGNOSIS — Z79899 Other long term (current) drug therapy: Secondary | ICD-10-CM | POA: Insufficient documentation

## 2012-12-02 DIAGNOSIS — J4489 Other specified chronic obstructive pulmonary disease: Secondary | ICD-10-CM | POA: Insufficient documentation

## 2012-12-02 DIAGNOSIS — I10 Essential (primary) hypertension: Secondary | ICD-10-CM | POA: Insufficient documentation

## 2012-12-02 DIAGNOSIS — M25559 Pain in unspecified hip: Secondary | ICD-10-CM | POA: Insufficient documentation

## 2012-12-02 DIAGNOSIS — M79606 Pain in leg, unspecified: Secondary | ICD-10-CM

## 2012-12-02 DIAGNOSIS — Z7982 Long term (current) use of aspirin: Secondary | ICD-10-CM | POA: Insufficient documentation

## 2012-12-02 DIAGNOSIS — J449 Chronic obstructive pulmonary disease, unspecified: Secondary | ICD-10-CM | POA: Insufficient documentation

## 2012-12-02 DIAGNOSIS — R079 Chest pain, unspecified: Secondary | ICD-10-CM | POA: Insufficient documentation

## 2012-12-02 DIAGNOSIS — R0609 Other forms of dyspnea: Secondary | ICD-10-CM | POA: Insufficient documentation

## 2012-12-02 DIAGNOSIS — M7989 Other specified soft tissue disorders: Secondary | ICD-10-CM

## 2012-12-02 LAB — CBC WITH DIFFERENTIAL/PLATELET
Basophils Absolute: 0 10*3/uL (ref 0.0–0.1)
Eosinophils Relative: 10 % — ABNORMAL HIGH (ref 0–5)
HCT: 24.6 % — ABNORMAL LOW (ref 36.0–46.0)
Hemoglobin: 8.4 g/dL — ABNORMAL LOW (ref 12.0–15.0)
Lymphocytes Relative: 21 % (ref 12–46)
Lymphs Abs: 1.6 10*3/uL (ref 0.7–4.0)
MCV: 85.1 fL (ref 78.0–100.0)
Monocytes Absolute: 0.9 10*3/uL (ref 0.1–1.0)
Monocytes Relative: 12 % (ref 3–12)
Neutro Abs: 4.2 10*3/uL (ref 1.7–7.7)
WBC: 7.4 10*3/uL (ref 4.0–10.5)

## 2012-12-02 LAB — POCT I-STAT, CHEM 8
Chloride: 107 mEq/L (ref 96–112)
Creatinine, Ser: 1.5 mg/dL — ABNORMAL HIGH (ref 0.50–1.10)
HCT: 24 % — ABNORMAL LOW (ref 36.0–46.0)
Hemoglobin: 8.2 g/dL — ABNORMAL LOW (ref 12.0–15.0)
Potassium: 3.7 mEq/L (ref 3.5–5.1)
Sodium: 145 mEq/L (ref 135–145)

## 2012-12-02 LAB — BASIC METABOLIC PANEL
CO2: 28 mEq/L (ref 19–32)
Chloride: 106 mEq/L (ref 96–112)
GFR calc Af Amer: 35 mL/min — ABNORMAL LOW (ref >90)
Potassium: 3.7 mEq/L (ref 3.5–5.1)

## 2012-12-02 LAB — PROTIME-INR: INR: 1.04 (ref 0.00–1.49)

## 2012-12-02 LAB — APTT: aPTT: 44 seconds — ABNORMAL HIGH (ref 24–37)

## 2012-12-02 MED ORDER — HYDROCODONE-ACETAMINOPHEN 5-500 MG PO TABS: 1.0000 | ORAL_TABLET | Freq: Four times a day (QID) | ORAL | Status: DC | PRN

## 2012-12-02 NOTE — Progress Notes (Signed)
VASCULAR LAB PRELIMINARY  PRELIMINARY  PRELIMINARY  PRELIMINARY  Bilateral lower extremity venous duplex completed.    Preliminary report:  Bilateral:  No evidence of DVT, superficial thrombosis, or Baker's Cyst.   VASCULAR LAB PRELIMINARY  ARTERIAL  ABI completed:    RIGHT    LEFT    PRESSURE WAVEFORM  PRESSURE WAVEFORM  BRACHIAL 136 Triphasic BRACHIAL 137 Triphasic  DP 161 Triphasic DP 157 Triphasic         PT 159 Biphasic PT 155 Biphasic                  RIGHT LEFT  ABI 1.18 1.15   ABIs indicate normal arterial flow bilaterally. Doppler waveforms indicate normal arterial flow bilaterally. Duplex scan reveals minimal diffuse heterogeneous plaque throughout with no evidence of significant stenosis.  Ebony Peterson, RVS 12/02/2012, 12:46 PM

## 2012-12-02 NOTE — ED Notes (Addendum)
Patient states she has been having BLE cramps, intermittently, now they are nightly. Patient states she has cramping in Left lower leg, entire Right. Family states patient was limping this morning, and thus the reason for their visit to the ER this am. Patient states she has told her PCP about the cramping, per family he seemed unconcerned "she's 29"

## 2012-12-02 NOTE — ED Notes (Signed)
Pt c/o severe right hip pain; bilateral leg cramping; family noticed pt limping yesterday; no known injury

## 2012-12-02 NOTE — ED Provider Notes (Signed)
1:15 PM The patient's venous studies, arterial studies, ABIs bilaterally were without significant abnormality.  Discharge home with primary care followup.  This is likely chronic arthritis and arthritic related pain.  The patient be discharged home with pain medicine to treat her pain.  Lyanne Co, MD 12/02/12 [phone]

## 2012-12-02 NOTE — ED Provider Notes (Signed)
History     CSN: [id number]  Arrival date & time 12/02/12  0436   First MD Initiated Contact with Patient 12/02/12 [phone]      Chief Complaint  Patient presents with  . Hip Pain  . leg cramps     (Consider location/radiation/quality/duration/timing/severity/associated sxs/prior treatment) HPI Comments: 78 year old female with a history of hypertension, history of breast cancer status post lumpectomy in 1999 who had a mammogram yesterday. She states that she's been having bilateral leg cramping for upwards of one year but over the last week has become worse especially on the right lower extremity. Her left lower extremity has intermittent cramping up to the knee but the right lower extremity has had cramping and muscle pain up into the hip. This gets worse when she walks, she is pain-free when she is lying down or sitting down. She denies any change in color of her legs but states that her right leg tends to be cold. The symptoms are intermittent, gradually worsening, not associated with fevers numbness or weakness. The patient denies a history of venous trauma embolism, and no history of recent chest pain or shortness of breath.  Patient is a 78 y.o. female presenting with hip pain. The history is provided by the patient and a relative.  Hip Pain    Past Medical History  Diagnosis Date  . Asthma   . Hypertension   . COPD (chronic obstructive pulmonary disease)   . Breast CA     History reviewed. No pertinent past surgical history.  Family History  Problem Relation Age of Onset  . Diabetes Sister   . Diabetes Sister     History  Substance Use Topics  . Smoking status: Never Smoker   . Smokeless tobacco: Never Used  . Alcohol Use: No    OB History    Grav Para Term Preterm Abortions TAB SAB Ect Mult Living                  Review of Systems  All other systems reviewed and are negative.    Allergies  Review of patient's allergies indicates no known  allergies.  Home Medications   Current Outpatient Rx  Name  Route  Sig  Dispense  Refill  . ALBUTEROL SULFATE HFA 108 (90 BASE) MCG/ACT IN AERS   Inhalation   Inhale 2 puffs into the lungs every 4 (four) hours as needed. For wheezing or shortness of breath         . AMLODIPINE BESYLATE 5 MG PO TABS   Oral   Take 5 mg by mouth every morning.          . ASPIRIN EC 81 MG PO TBEC   Oral   Take 81 mg by mouth every morning.          Marland Kitchen FLUTICASONE-SALMETEROL 100-50 MCG/DOSE IN AEPB   Inhalation   Inhale 1 puff into the lungs every 12 (twelve) hours.         . OMEPRAZOLE 20 MG PO CPDR   Oral   Take 20 mg by mouth every morning. Before breakfast         . OXYBUTYNIN CHLORIDE 5 MG PO TABS   Oral   Take 2.5 mg by mouth 3 (three) times daily.          Marland Kitchen POTASSIUM CHLORIDE CRYS ER 20 MEQ PO TBCR   Oral   Take 20 mEq by mouth 2 (two) times daily.         Marland Kitchen  PREDNISONE 10 MG PO TABS   Oral   Take 5 mg by mouth every other day.          . TRIAMTERENE-HCTZ 37.5-25 MG PO TABS   Oral   Take 1 tablet by mouth every morning.           BP 146/63  Pulse 89  Temp 99.1 F (37.3 C)  Resp 20  SpO2 100%  Physical Exam  Nursing note and vitals reviewed. Constitutional: She appears well-developed and well-nourished. No distress.  HENT:  Head: Normocephalic and atraumatic.  Mouth/Throat: Oropharynx is clear and moist. No oropharyngeal exudate.  Eyes: Conjunctivae normal and EOM are normal. Pupils are equal, round, and reactive to light. Right eye exhibits no discharge. Left eye exhibits no discharge. No scleral icterus.  Neck: Normal range of motion. Neck supple. No JVD present. No thyromegaly present.  Cardiovascular: Normal rate, regular rhythm, normal heart sounds and intact distal pulses.  Exam reveals no gallop and no friction rub.   No murmur heard. Pulmonary/Chest: Effort normal and breath sounds normal. No respiratory distress. She has no wheezes. She has no  rales.  Abdominal: Soft. Bowel sounds are normal. She exhibits no distension and no mass. There is no tenderness.  Musculoskeletal: Normal range of motion. She exhibits no edema and no tenderness.       Mild tenderness with range of motion of the right hip, no leg length discrepancies, weak pulses at the right foot compared to the left foot. The right foot slightly colder than the left foot, normal capillary refill of the right foot, normal range of motion at the knee and the ankle on the right. There is minimal tenderness to the thigh and the calf muscles of the bilateral lower extremities and there is no significant asymmetry other than increased swelling of her left ankle. She states this is a chronic finding for her.  Lymphadenopathy:    She has no cervical adenopathy.  Neurological: She is alert. Coordination normal.  Skin: Skin is warm and dry. No rash noted. No erythema.  Psychiatric: She has a normal mood and affect. Her behavior is normal.    ED Course  Procedures (including critical care time)  Labs Reviewed  POCT I-STAT, CHEM 8 - Abnormal; Notable for the following:    BUN 29 (*)     Creatinine, Ser 1.50 (*)     Hemoglobin 8.2 (*)     HCT 24.0 (*)     All other components within normal limits  BASIC METABOLIC PANEL - Abnormal; Notable for the following:    BUN 28 (*)     Creatinine, Ser 1.45 (*)     GFR calc non Af Amer 30 (*)     GFR calc Af Amer 35 (*)     All other components within normal limits  CBC WITH DIFFERENTIAL - Abnormal; Notable for the following:    RBC 2.89 (*)     Hemoglobin 8.4 (*)     HCT 24.6 (*)     Eosinophils Relative 10 (*)     Eosinophils Absolute 0.8 (*)     All other components within normal limits  D-DIMER, QUANTITATIVE - Abnormal; Notable for the following:    D-Dimer, Quant 0.91 (*)     All other components within normal limits  APTT - Abnormal; Notable for the following:    aPTT 44 (*)     All other components within normal limits   PROTIME-INR   Dg Hip Complete Right  12/02/2012  *RADIOLOGY REPORT*  Clinical Data: Right hip and leg pain  RIGHT HIP - COMPLETE 2+ VIEW  Comparison: 06/07/2010 radiographs  Findings: Osteopenia.  Severe right hip DJD.  Moderate left hip DJD.  Lower lumbar degenerative changes.  Sacrum obscured by overlying bowel. No displaced acute fracture or dislocation identified.  Numerous oval soft tissue calcifications project over the hip joints.  IMPRESSION: Severe right and moderate left hip DJD/osteoarthritic change.  Numerous soft tissue calcifications project over the hip joints, some of which may reflect loose bodies.   Original Report Authenticated By: Jearld Lesch, M.D.    Mm Digital Screening  12/01/2012  *RADIOLOGY REPORT*  Clinical Data: Screening.  DIGITAL BILATERAL SCREENING MAMMOGRAM WITH CAD  Comparison:  Previous exams.  FINDINGS:  ACR Breast Density Category 4: The breast tissue is extremely dense.  No suspicious masses, architectural distortion, or calcifications are present.  Images were processed with CAD.  IMPRESSION: No mammographic evidence of malignancy.  A result letter of this screening mammogram will be mailed directly to the patient.  RECOMMENDATION: Screening mammogram in one year. (Code:SM-B-01Y)  BI-RADS CATEGORY 2:  Benign finding(s).   Original Report Authenticated By: Sherian Rein, M.D.      No diagnosis found.    MDM  The patient is in no acute distress, she has no significant abnormal vital signs but she has lower extremity pain. I have personally interpreted her right hip x-ray series and find her to be arthritic changes of the right hip joint however there does not appear in acute fracture. Her symptoms with exertion are consistent with claudication or possibly DVT. The patient does have a history of cancer thus I would entertain DVT. D-dimer ordered, ABIs were ordered, the patient has declined pain medications at this time.  D-dimer slightly elevated, x-ray  shows significant arthritis of the right hip, ultrasound ordered of the lower extremities to rule out deep venous thrombosis given the patient's history of cancer and elevated d-dimer.  Change of shift, care signed out to Dr. Darnelle Maffucci, MD 12/02/12 [phone]

## 2012-12-02 NOTE — ED Notes (Signed)
Pts daughter was called as she requested.Will pick up her mother in 

## 2013-01-30 ENCOUNTER — Encounter (HOSPITAL_COMMUNITY): Payer: Self-pay | Admitting: *Deleted

## 2013-01-30 ENCOUNTER — Emergency Department (HOSPITAL_COMMUNITY): Payer: Medicare Other

## 2013-01-30 ENCOUNTER — Emergency Department (HOSPITAL_COMMUNITY)
Admission: EM | Admit: 2013-01-30 | Discharge: 2013-01-31 | Disposition: A | Discharge status: Home / Self Care | Payer: Medicare Other | Attending: Emergency Medicine | Admitting: Emergency Medicine

## 2013-01-30 DIAGNOSIS — R0609 Other forms of dyspnea: Secondary | ICD-10-CM | POA: Insufficient documentation

## 2013-01-30 DIAGNOSIS — N289 Disorder of kidney and ureter, unspecified: Secondary | ICD-10-CM | POA: Insufficient documentation

## 2013-01-30 DIAGNOSIS — Z7982 Long term (current) use of aspirin: Secondary | ICD-10-CM | POA: Insufficient documentation

## 2013-01-30 DIAGNOSIS — R0989 Other specified symptoms and signs involving the circulatory and respiratory systems: Secondary | ICD-10-CM | POA: Insufficient documentation

## 2013-01-30 DIAGNOSIS — J4489 Other specified chronic obstructive pulmonary disease: Secondary | ICD-10-CM | POA: Insufficient documentation

## 2013-01-30 DIAGNOSIS — R0600 Dyspnea, unspecified: Secondary | ICD-10-CM

## 2013-01-30 DIAGNOSIS — R631 Polydipsia: Secondary | ICD-10-CM | POA: Insufficient documentation

## 2013-01-30 DIAGNOSIS — D649 Anemia, unspecified: Secondary | ICD-10-CM | POA: Insufficient documentation

## 2013-01-30 DIAGNOSIS — R252 Cramp and spasm: Secondary | ICD-10-CM | POA: Insufficient documentation

## 2013-01-30 DIAGNOSIS — I10 Essential (primary) hypertension: Secondary | ICD-10-CM | POA: Insufficient documentation

## 2013-01-30 DIAGNOSIS — Z79899 Other long term (current) drug therapy: Secondary | ICD-10-CM | POA: Insufficient documentation

## 2013-01-30 DIAGNOSIS — Z853 Personal history of malignant neoplasm of breast: Secondary | ICD-10-CM | POA: Insufficient documentation

## 2013-01-30 DIAGNOSIS — J45909 Unspecified asthma, uncomplicated: Secondary | ICD-10-CM | POA: Insufficient documentation

## 2013-01-30 DIAGNOSIS — J449 Chronic obstructive pulmonary disease, unspecified: Secondary | ICD-10-CM | POA: Insufficient documentation

## 2013-01-30 DIAGNOSIS — Z8739 Personal history of other diseases of the musculoskeletal system and connective tissue: Secondary | ICD-10-CM | POA: Insufficient documentation

## 2013-01-30 MED ORDER — AEROCHAMBER PLUS FLO-VU MEDIUM MISC
1.0000 | Freq: Once | Status: AC
  Administered 2013-01-31: 1
  Filled 2013-01-30: qty 1

## 2013-01-30 MED ORDER — ALBUTEROL SULFATE HFA 108 (90 BASE) MCG/ACT IN AERS
1.0000 | INHALATION_SPRAY | RESPIRATORY_TRACT | Status: DC | PRN
  Administered 2013-01-31: 2 via RESPIRATORY_TRACT
  Filled 2013-01-30: qty 6.7

## 2013-01-30 NOTE — ED Notes (Signed)
Per pt report: Pt c/o increased pressure around her chest when pt breathes.  Pt endorses SOB. Pt reports Sx started last night.  Pt has increased her water intake d/t dry mouth. Pt reports her ProAir is not working right and won't be able to obtain a new one until tomorrow.

## 2013-01-30 NOTE — ED Notes (Signed)
EKG old and new given to EDP,Cook,MD. 

## 2013-01-31 LAB — POCT I-STAT, CHEM 8
BUN: 31 mg/dL — ABNORMAL HIGH (ref 6–23)
Chloride: 108 mEq/L (ref 96–112)
Creatinine, Ser: 1.8 mg/dL — ABNORMAL HIGH (ref 0.50–1.10)
Hemoglobin: 7.8 g/dL — ABNORMAL LOW (ref 12.0–15.0)
Potassium: 4.4 mEq/L (ref 3.5–5.1)
Sodium: 141 mEq/L (ref 135–145)

## 2013-01-31 MED ORDER — FERROUS SULFATE 325 (65 FE) MG PO TABS: 325.0000 mg | ORAL_TABLET | Freq: Every day | ORAL | Status: DC

## 2013-01-31 NOTE — ED Provider Notes (Addendum)
History     CSN: [id number]  Arrival date & time 01/30/13  2122   First MD Initiated Contact with Patient 01/30/13 2306      No chief complaint on file.   (Consider location/radiation/quality/duration/timing/severity/associated sxs/prior treatment) HPI 79 year old female presents emergency apartment complaining of shortness of breath and cramping in her hands. Patient reports she's had intermittent shortness of breath over the last 2-3 days. She used her inhaler last night, but reports it felt like a coated her tongue and back of throat and did not help her lungs. She's been very thirsty all day and drinking more water to rid herself of the bad taste. She denies any cough, no chest pain. Hand cramping started when she arrived to the emergency department. Upon my evaluation, it is improved. She's had these symptoms in the past, and is associated with her arthritis. Patient is asymptomatic and without complaint at this time. Past Medical History  Diagnosis Date  . Asthma   . Hypertension   . COPD (chronic obstructive pulmonary disease)   . Breast CA     Past Surgical History  Procedure Laterality Date  . Vaginal hysterectomy      Family History  Problem Relation Age of Onset  . Diabetes Sister   . Diabetes Sister     History  Substance Use Topics  . Smoking status: Never Smoker   . Smokeless tobacco: Never Used  . Alcohol Use: No    OB History   Grav Para Term Preterm Abortions TAB SAB Ect Mult Living                  Review of Systems  See History of Present Illness; otherwise all other systems are reviewed and negative  Allergies  Review of patient's allergies indicates no known allergies.  Home Medications   Current Outpatient Rx  Name  Route  Sig  Dispense  Refill  . albuterol (PROAIR HFA) 108 (90 BASE) MCG/ACT inhaler   Inhalation   Inhale 2 puffs into the lungs every 4 (four) hours as needed. For wheezing or shortness of breath         . amLODipine  (NORVASC) 5 MG tablet   Oral   Take 5 mg by mouth every morning.          Marland Kitchen aspirin EC 81 MG tablet   Oral   Take 81 mg by mouth every morning.          . Fluticasone-Salmeterol (ADVAIR) 100-50 MCG/DOSE AEPB   Inhalation   Inhale 1 puff into the lungs every 12 (twelve) hours.         Marland Kitchen HYDROcodone-acetaminophen (VICODIN) 5-500 MG per tablet   Oral   Take 1 tablet by mouth every 6 (six) hours as needed for pain.   15 tablet   0   . omeprazole (PRILOSEC) 20 MG capsule   Oral   Take 20 mg by mouth every morning. Before breakfast         . oxybutynin (DITROPAN) 5 MG tablet   Oral   Take 2.5 mg by mouth 3 (three) times daily.          . potassium chloride SA (K-DUR,KLOR-CON) 20 MEQ tablet   Oral   Take 20 mEq by mouth 2 (two) times daily.         . predniSONE (DELTASONE) 10 MG tablet   Oral   Take 5 mg by mouth every other day.          Marland Kitchen  triamterene-hydrochlorothiazide (MAXZIDE-25) 37.5-25 MG per tablet   Oral   Take 1 tablet by mouth every morning.         . ferrous sulfate 325 (65 FE) MG tablet   Oral   Take 1 tablet (325 mg total) by mouth daily.   30 tablet   0     BP 147/69  Pulse 89  Temp(Src) 97.5 F (36.4 C) (Oral)  Resp 18  SpO2 93%  Physical Exam  Nursing note and vitals reviewed. Constitutional: She is oriented to person, place, and time. She appears well-developed and well-nourished.  Elderly, frail  HENT:  Head: Normocephalic and atraumatic.  Nose: Nose normal.  Mouth/Throat: Oropharynx is clear and moist.  Eyes: Conjunctivae and EOM are normal. Pupils are equal, round, and reactive to light.  Neck: Normal range of motion. Neck supple. No JVD present. No tracheal deviation present. No thyromegaly present.  Cardiovascular: Normal rate, regular rhythm, normal heart sounds and intact distal pulses.  Exam reveals no gallop and no friction rub.   No murmur heard. Pulmonary/Chest: Effort normal and breath sounds normal. No stridor.  No respiratory distress. She has no wheezes. She has no rales. She exhibits no tenderness.  Abdominal: Soft. Bowel sounds are normal. She exhibits no distension and no mass. There is no tenderness. There is no rebound and no guarding.  Musculoskeletal: Normal range of motion. She exhibits no edema and no tenderness.  Lymphadenopathy:    She has no cervical adenopathy.  Neurological: She is alert and oriented to person, place, and time. She exhibits normal muscle tone. Coordination normal.  Skin: Skin is warm and dry. No rash noted. No erythema. No pallor.  Psychiatric: She has a normal mood and affect. Her behavior is normal. Judgment and thought content normal.    ED Course  Procedures (including critical care time)  Labs Reviewed  POCT I-STAT, CHEM 8 - Abnormal; Notable for the following:    BUN 31 (*)    Creatinine, Ser 1.80 (*)    Calcium, Ion 1.09 (*)    Hemoglobin 7.8 (*)    HCT 23.0 (*)    All other components within normal limits   Dg Chest 2 View  01/30/2013  *RADIOLOGY REPORT*  Clinical Data: Shortness of breath; history of asthma and breast cancer.  CHEST - 2 VIEW  Comparison: Chest radiograph performed 05/23/2012  Findings: There is elevation of the left hemidiaphragm.  The lungs remain grossly clear.  No focal consolidation, pleural effusion or pneumothorax is seen.  A likely calcified granuloma is noted at the left lower lung zone, also seen on the prior study.  The heart is normal in size; the mediastinal contour is within normal limits.  No acute osseous abnormalities are seen.  There is chronic superior subluxation of both humeral heads.  IMPRESSION: Elevation of the left hemidiaphragm again noted; no acute cardiopulmonary process seen.   Original Report Authenticated By: Tonia Ghent, M.D.     Date: 01/31/2013  Rate: 86  Rhythm: normal sinus rhythm  QRS Axis: normal  Intervals: normal  ST/T Wave abnormalities: normal  Conduction Disutrbances:none  Narrative  Interpretation: LVH  Old EKG Reviewed: unchanged    1. Renal insufficiency   2. Anemia   3. Dyspnea       MDM  79 year old female with complaint is dyspnea which has resolved as well as hand cramping which has resolved. Patient noted to have anemia, seen on lab work last month as well. Will start her on iron and  have her followup with her primary care Dr. Patient has been given spacer to use with her inhaler. She is to followup with her primary care Dr.        Olivia Mackie, MD 01/31/13 [id number]  Olivia Mackie, MD 01/31/13 [phone]

## 2013-02-01 ENCOUNTER — Encounter (HOSPITAL_COMMUNITY): Payer: Self-pay | Admitting: *Deleted

## 2013-02-01 ENCOUNTER — Emergency Department (HOSPITAL_COMMUNITY)
Admission: EM | Admit: 2013-02-01 | Discharge: 2013-02-01 | Disposition: A | Discharge status: Home / Self Care | Payer: Medicare Other | Attending: Emergency Medicine | Admitting: Emergency Medicine

## 2013-02-01 ENCOUNTER — Emergency Department (HOSPITAL_COMMUNITY): Payer: Medicare Other

## 2013-02-01 DIAGNOSIS — J449 Chronic obstructive pulmonary disease, unspecified: Secondary | ICD-10-CM

## 2013-02-01 DIAGNOSIS — J4489 Other specified chronic obstructive pulmonary disease: Secondary | ICD-10-CM | POA: Insufficient documentation

## 2013-02-01 DIAGNOSIS — J45901 Unspecified asthma with (acute) exacerbation: Secondary | ICD-10-CM | POA: Insufficient documentation

## 2013-02-01 DIAGNOSIS — R062 Wheezing: Secondary | ICD-10-CM | POA: Insufficient documentation

## 2013-02-01 DIAGNOSIS — Z79899 Other long term (current) drug therapy: Secondary | ICD-10-CM | POA: Insufficient documentation

## 2013-02-01 DIAGNOSIS — Z853 Personal history of malignant neoplasm of breast: Secondary | ICD-10-CM | POA: Insufficient documentation

## 2013-02-01 DIAGNOSIS — I10 Essential (primary) hypertension: Secondary | ICD-10-CM | POA: Insufficient documentation

## 2013-02-01 DIAGNOSIS — Z7982 Long term (current) use of aspirin: Secondary | ICD-10-CM | POA: Insufficient documentation

## 2013-02-01 LAB — CBC
Hemoglobin: 7.7 g/dL — ABNORMAL LOW (ref 12.0–15.0)
MCH: 24.8 pg — ABNORMAL LOW (ref 26.0–34.0)
MCV: 77.8 fL — ABNORMAL LOW (ref 78.0–100.0)
RBC: 3.11 MIL/uL — ABNORMAL LOW (ref 3.87–5.11)

## 2013-02-01 LAB — BASIC METABOLIC PANEL
CO2: 28 mEq/L (ref 19–32)
Calcium: 8.8 mg/dL (ref 8.4–10.5)
Glucose, Bld: 100 mg/dL — ABNORMAL HIGH (ref 70–99)
Sodium: 139 mEq/L (ref 135–145)

## 2013-02-01 MED ORDER — HYDROCODONE-ACETAMINOPHEN 5-325 MG PO TABS
1.0000 | ORAL_TABLET | Freq: Once | ORAL | Status: AC
  Administered 2013-02-01: 1 via ORAL
  Filled 2013-02-01: qty 1

## 2013-02-01 MED ORDER — PREDNISONE 10 MG PO TABS: 60.0000 mg | ORAL_TABLET | Freq: Every day | ORAL | Status: DC

## 2013-02-01 MED ORDER — IPRATROPIUM BROMIDE 0.02 % IN SOLN
0.5000 mg | Freq: Once | RESPIRATORY_TRACT | Status: AC
  Administered 2013-02-01: 0.5 mg via RESPIRATORY_TRACT
  Filled 2013-02-01: qty 2.5

## 2013-02-01 MED ORDER — ALBUTEROL SULFATE (5 MG/ML) 0.5% IN NEBU
5.0000 mg | INHALATION_SOLUTION | Freq: Once | RESPIRATORY_TRACT | Status: AC
  Administered 2013-02-01: 5 mg via RESPIRATORY_TRACT
  Filled 2013-02-01: qty 1

## 2013-02-01 MED ORDER — ALBUTEROL SULFATE HFA 108 (90 BASE) MCG/ACT IN AERS: 2.0000 | INHALATION_SPRAY | RESPIRATORY_TRACT | Status: DC | PRN

## 2013-02-01 MED ORDER — PREDNISONE 20 MG PO TABS
60.0000 mg | ORAL_TABLET | Freq: Once | ORAL | Status: AC
  Administered 2013-02-01: 60 mg via ORAL
  Filled 2013-02-01: qty 3

## 2013-02-01 NOTE — ED Notes (Signed)
Attempted blood draw x 2, but unsuccessful. 

## 2013-02-01 NOTE — ED Notes (Signed)
Pt in c/o intermittent shortness of breath today, seen last night for same, states she has episodes like this often, speaking in full sentences, no acute distress noted

## 2013-02-01 NOTE — ED Notes (Signed)
Patient transported to X-ray 

## 2013-02-01 NOTE — ED Provider Notes (Signed)
History     CSN: [id number]  Arrival date & time 02/01/13  0152   First MD Initiated Contact with Patient 02/01/13 0200      Chief Complaint  Patient presents with  . Shortness of Breath     The history is provided by medical records and the patient.   the patient continues to have some intermittent shortness of breath.  She denies cough and congestion.  No fevers or chills.  No history of DVT or pulmonary embolism.  No unilateral leg swelling.  She denies bilateral leg swelling.  No history of congestive heart failure.  She does have a history of COPD and asthma.  She does not smoke cigarettes.  She denies fevers and chills.  Symptoms are mild in severity.  No orthopnea or dyspnea on exertion.  No chest pain.  Past Medical History  Diagnosis Date  . Asthma   . Hypertension   . COPD (chronic obstructive pulmonary disease)   . Breast CA     Past Surgical History  Procedure Laterality Date  . Vaginal hysterectomy      Family History  Problem Relation Age of Onset  . Diabetes Sister   . Diabetes Sister     History  Substance Use Topics  . Smoking status: Never Smoker   . Smokeless tobacco: Never Used  . Alcohol Use: No    OB History   Grav Para Term Preterm Abortions TAB SAB Ect Mult Living                  Review of Systems  Respiratory: Positive for shortness of breath.   All other systems reviewed and are negative.    Allergies  Review of patient's allergies indicates no known allergies.  Home Medications   Current Outpatient Rx  Name  Route  Sig  Dispense  Refill  . albuterol (PROAIR HFA) 108 (90 BASE) MCG/ACT inhaler   Inhalation   Inhale 2 puffs into the lungs every 4 (four) hours as needed. For wheezing or shortness of breath         . amLODipine (NORVASC) 5 MG tablet   Oral   Take 5 mg by mouth every morning.          Marland Kitchen aspirin EC 81 MG tablet   Oral   Take 81 mg by mouth every morning.          . ferrous sulfate 325 (65 FE) MG  tablet   Oral   Take 1 tablet (325 mg total) by mouth daily.   30 tablet   0   . Fluticasone-Salmeterol (ADVAIR) 100-50 MCG/DOSE AEPB   Inhalation   Inhale 1 puff into the lungs every 12 (twelve) hours.         Marland Kitchen omeprazole (PRILOSEC) 20 MG capsule   Oral   Take 20 mg by mouth every morning. Before breakfast         . oxybutynin (DITROPAN) 5 MG tablet   Oral   Take 2.5 mg by mouth 3 (three) times daily.          . potassium chloride SA (K-DUR,KLOR-CON) 20 MEQ tablet   Oral   Take 20 mEq by mouth 2 (two) times daily.         . predniSONE (DELTASONE) 10 MG tablet   Oral   Take 5 mg by mouth every other day.          . triamterene-hydrochlorothiazide (MAXZIDE-25) 37.5-25 MG per tablet   Oral  Take 1 tablet by mouth every morning.         Marland Kitchen albuterol (PROVENTIL HFA;VENTOLIN HFA) 108 (90 BASE) MCG/ACT inhaler   Inhalation   Inhale 2 puffs into the lungs every 4 (four) hours as needed for wheezing.   1 Inhaler   0   . predniSONE (DELTASONE) 10 MG tablet   Oral   Take 6 tablets (60 mg total) by mouth daily.   30 tablet   0     BP 119/49  Pulse 85  Temp(Src) 98.2 F (36.8 C) (Oral)  Resp 22  SpO2 97%  Physical Exam  Nursing note and vitals reviewed. Constitutional: She is oriented to person, place, and time. She appears well-developed and well-nourished. No distress.  HENT:  Head: Normocephalic and atraumatic.  Eyes: EOM are normal.  Neck: Normal range of motion.  Cardiovascular: Normal rate, regular rhythm and normal heart sounds.   Pulmonary/Chest: Effort normal. She has wheezes.  Abdominal: Soft. She exhibits no distension. There is no tenderness.  Musculoskeletal: Normal range of motion.  Neurological: She is alert and oriented to person, place, and time.  Skin: Skin is warm and dry.  Psychiatric: She has a normal mood and affect. Judgment normal.    ED Course  Procedures (including critical care time)   Date: 02/01/2013  Rate: 87   Rhythm: normal sinus rhythm  QRS Axis: normal  Intervals: normal  ST/T Wave abnormalities: normal  Conduction Disutrbances: none  Narrative Interpretation:   Old EKG Reviewed: No significant changes noted     Labs Reviewed  CBC - Abnormal; Notable for the following:    RBC 3.11 (*)    Hemoglobin 7.7 (*)    HCT 24.2 (*)    MCV 77.8 (*)    MCH 24.8 (*)    Platelets 402 (*)    All other components within normal limits  BASIC METABOLIC PANEL - Abnormal; Notable for the following:    Glucose, Bld 100 (*)    BUN 25 (*)    Creatinine, Ser 1.53 (*)    GFR calc non Af Amer 28 (*)    GFR calc Af Amer 33 (*)    All other components within normal limits  TROPONIN I   Dg Chest 2 View  02/01/2013  *RADIOLOGY REPORT*  Clinical Data: Shortness of breath, weakness  CHEST - 2 VIEW  Comparison: 01/30/2013  Findings: Elevated left hemidiaphragm.  Cardiomediastinal contours within normal range.  Hyperinflation with chronic interstitial coarsening.  Mild left lung base opacity, favor scarring or atelectasis.  No pleural effusion or pneumothorax.  Osteopenia limits osseous evaluation, no overt interval change.  IMPRESSION: Elevation of the left hemidiaphragm with associated opacity, favor scarring or atelectasis.   Original Report Authenticated By: Jearld Lesch, M.D.    Dg Chest 2 View  01/30/2013  *RADIOLOGY REPORT*  Clinical Data: Shortness of breath; history of asthma and breast cancer.  CHEST - 2 VIEW  Comparison: Chest radiograph performed 05/23/2012  Findings: There is elevation of the left hemidiaphragm.  The lungs remain grossly clear.  No focal consolidation, pleural effusion or pneumothorax is seen.  A likely calcified granuloma is noted at the left lower lung zone, also seen on the prior study.  The heart is normal in size; the mediastinal contour is within normal limits.  No acute osseous abnormalities are seen.  There is chronic superior subluxation of both humeral heads.  IMPRESSION:  Elevation of the left hemidiaphragm again noted; no acute cardiopulmonary process seen.  Original Report Authenticated By: Tonia Ghent, M.D.    I personally reviewed the imaging tests through PACS system I reviewed available ER/hospitalization records through the EMR   1. COPD (chronic obstructive pulmonary disease)       MDM  4:56 AM Patient feels much better after her breathing treatment.  This appears to be a COPD exacerbation.  Norris pneumonia on chest x-ray.  Close PCP followup.  I will place the patient on 60 mg of prednisone x5 days.  Schedule albuterol every 4 hours and when necessary        Lyanne Co, MD 02/01/13 [phone]

## 2013-04-22 ENCOUNTER — Emergency Department (HOSPITAL_COMMUNITY)
Admission: EM | Admit: 2013-04-22 | Discharge: 2013-04-22 | Disposition: A | Discharge status: Home / Self Care | Payer: Medicare Other | Attending: Emergency Medicine | Admitting: Emergency Medicine

## 2013-04-22 ENCOUNTER — Emergency Department (HOSPITAL_COMMUNITY): Payer: Medicare Other

## 2013-04-22 ENCOUNTER — Encounter (HOSPITAL_COMMUNITY): Payer: Self-pay | Admitting: *Deleted

## 2013-04-22 DIAGNOSIS — Z853 Personal history of malignant neoplasm of breast: Secondary | ICD-10-CM | POA: Insufficient documentation

## 2013-04-22 DIAGNOSIS — I10 Essential (primary) hypertension: Secondary | ICD-10-CM | POA: Insufficient documentation

## 2013-04-22 DIAGNOSIS — M25559 Pain in unspecified hip: Secondary | ICD-10-CM | POA: Insufficient documentation

## 2013-04-22 DIAGNOSIS — R0609 Other forms of dyspnea: Secondary | ICD-10-CM | POA: Insufficient documentation

## 2013-04-22 DIAGNOSIS — Z79899 Other long term (current) drug therapy: Secondary | ICD-10-CM | POA: Insufficient documentation

## 2013-04-22 DIAGNOSIS — J441 Chronic obstructive pulmonary disease with (acute) exacerbation: Secondary | ICD-10-CM

## 2013-04-22 DIAGNOSIS — IMO0002 Reserved for concepts with insufficient information to code with codable children: Secondary | ICD-10-CM | POA: Insufficient documentation

## 2013-04-22 DIAGNOSIS — D649 Anemia, unspecified: Secondary | ICD-10-CM | POA: Insufficient documentation

## 2013-04-22 DIAGNOSIS — Z7982 Long term (current) use of aspirin: Secondary | ICD-10-CM | POA: Insufficient documentation

## 2013-04-22 DIAGNOSIS — D721 Eosinophilia, unspecified: Secondary | ICD-10-CM | POA: Insufficient documentation

## 2013-04-22 DIAGNOSIS — R0989 Other specified symptoms and signs involving the circulatory and respiratory systems: Secondary | ICD-10-CM | POA: Insufficient documentation

## 2013-04-22 DIAGNOSIS — M25569 Pain in unspecified knee: Secondary | ICD-10-CM | POA: Insufficient documentation

## 2013-04-22 LAB — CBC WITH DIFFERENTIAL/PLATELET
Basophils Absolute: 0 10*3/uL (ref 0.0–0.1)
Lymphocytes Relative: 24 % (ref 12–46)
Lymphs Abs: 1.7 10*3/uL (ref 0.7–4.0)
Neutrophils Relative %: 42 % — ABNORMAL LOW (ref 43–77)
Platelets: 329 10*3/uL (ref 150–400)
RBC: 3.23 MIL/uL — ABNORMAL LOW (ref 3.87–5.11)
RDW: 16.2 % — ABNORMAL HIGH (ref 11.5–15.5)
WBC: 7.3 10*3/uL (ref 4.0–10.5)

## 2013-04-22 LAB — POCT I-STAT, CHEM 8
BUN: 27 mg/dL — ABNORMAL HIGH (ref 6–23)
Creatinine, Ser: 1.3 mg/dL — ABNORMAL HIGH (ref 0.50–1.10)
Hemoglobin: 8.8 g/dL — ABNORMAL LOW (ref 12.0–15.0)
Potassium: 3.3 mEq/L — ABNORMAL LOW (ref 3.5–5.1)
Sodium: 141 mEq/L (ref 135–145)
TCO2: 26 mmol/L (ref 0–100)

## 2013-04-22 LAB — POCT I-STAT TROPONIN I: Troponin i, poc: 0 ng/mL (ref 0.00–0.08)

## 2013-04-22 MED ORDER — LORATADINE 10 MG PO TABS
10.0000 mg | ORAL_TABLET | Freq: Once | ORAL | Status: AC
  Administered 2013-04-22: 10 mg via ORAL
  Filled 2013-04-22: qty 1

## 2013-04-22 MED ORDER — IPRATROPIUM BROMIDE 0.02 % IN SOLN
RESPIRATORY_TRACT | Status: AC
  Filled 2013-04-22: qty 2.5

## 2013-04-22 MED ORDER — ALBUTEROL SULFATE (5 MG/ML) 0.5% IN NEBU: 2.5000 mg | INHALATION_SOLUTION | Freq: Four times a day (QID) | RESPIRATORY_TRACT | Status: DC | PRN

## 2013-04-22 MED ORDER — IPRATROPIUM BROMIDE 0.02 % IN SOLN
0.5000 mg | RESPIRATORY_TRACT | Status: DC
  Filled 2013-04-22: qty 2.5

## 2013-04-22 MED ORDER — ALBUTEROL SULFATE (5 MG/ML) 0.5% IN NEBU
INHALATION_SOLUTION | RESPIRATORY_TRACT | Status: AC
  Filled 2013-04-22: qty 0.5

## 2013-04-22 MED ORDER — OPTICHAMBER ADVANTAGE MISC
1.0000 | Freq: Once | Status: AC
  Administered 2013-04-22: 1
  Filled 2013-04-22: qty 1

## 2013-04-22 MED ORDER — ALBUTEROL SULFATE HFA 108 (90 BASE) MCG/ACT IN AERS: 2.0000 | INHALATION_SPRAY | RESPIRATORY_TRACT | Status: DC | PRN

## 2013-04-22 MED ORDER — ALBUTEROL SULFATE (5 MG/ML) 0.5% IN NEBU
5.0000 mg | INHALATION_SOLUTION | Freq: Once | RESPIRATORY_TRACT | Status: AC
  Administered 2013-04-22: 5 mg via RESPIRATORY_TRACT
  Filled 2013-04-22: qty 1

## 2013-04-22 MED ORDER — ALBUTEROL SULFATE (5 MG/ML) 0.5% IN NEBU: 5.0000 mg | INHALATION_SOLUTION | RESPIRATORY_TRACT | Status: DC

## 2013-04-22 MED ORDER — AEROCHAMBER PLUS W/MASK MISC
1.0000 | Freq: Once | Status: DC
  Filled 2013-04-22 (×2): qty 1

## 2013-04-22 MED ORDER — IPRATROPIUM BROMIDE 0.02 % IN SOLN: 0.5000 mg | Freq: Once | RESPIRATORY_TRACT | Status: DC

## 2013-04-22 MED ORDER — LORATADINE 10 MG PO TABS: 10.0000 mg | ORAL_TABLET | Freq: Every day | ORAL | Status: DC

## 2013-04-22 MED ORDER — PREDNISONE 20 MG PO TABS
60.0000 mg | ORAL_TABLET | Freq: Once | ORAL | Status: AC
  Administered 2013-04-22: 60 mg via ORAL
  Filled 2013-04-22: qty 3

## 2013-04-22 MED ORDER — PREDNISONE 50 MG PO TABS: 50.0000 mg | ORAL_TABLET | Freq: Every day | ORAL | Status: DC

## 2013-04-22 NOTE — ED Provider Notes (Signed)
History     CSN: [id number]  Arrival date & time 04/22/13  0546   First MD Initiated Contact with Patient 04/22/13 [phone]      Chief Complaint  Patient presents with  . Shortness of Breath   HPI Ebony Peterson is a 79 y.o. female with a history of COPD, she does not currently smoke, she presents with couple days of worsening shortness of breath but worsened overnight.  She denies any increased cough or production of sputum, denies any fevers, chills, no chest pain. Patient has no history of venous thromboembolic disease, she does not have any unilateral leg swelling or complain about leg pain. She complains about right knee pain and right hip pain. She says this pain is Moderate to severe, worse on palpation or bearing weight.  Breathing symptoms are moderate.   Past Medical History  Diagnosis Date  . Asthma   . Hypertension   . COPD (chronic obstructive pulmonary disease)   . Breast CA     Past Surgical History  Procedure Laterality Date  . Vaginal hysterectomy      Family History  Problem Relation Age of Onset  . Diabetes Sister   . Diabetes Sister     History  Substance Use Topics  . Smoking status: Never Smoker   . Smokeless tobacco: Never Used  . Alcohol Use: No    OB History   Grav Para Term Preterm Abortions TAB SAB Ect Mult Living                  Review of Systems At least 10pt or greater review of systems completed and are negative except where specified in the HPI.  Allergies  Review of patient's allergies indicates no known allergies.  Home Medications   Current Outpatient Rx  Name  Route  Sig  Dispense  Refill  . albuterol (PROAIR HFA) 108 (90 BASE) MCG/ACT inhaler   Inhalation   Inhale 2 puffs into the lungs every 4 (four) hours as needed. For wheezing or shortness of breath         . albuterol (PROVENTIL HFA;VENTOLIN HFA) 108 (90 BASE) MCG/ACT inhaler   Inhalation   Inhale 2 puffs into the lungs every 4 (four) hours as needed for  wheezing.   1 Inhaler   0   . amLODipine (NORVASC) 5 MG tablet   Oral   Take 5 mg by mouth every morning.          Marland Kitchen aspirin EC 81 MG tablet   Oral   Take 81 mg by mouth every morning.          . ferrous sulfate 325 (65 FE) MG tablet   Oral   Take 1 tablet (325 mg total) by mouth daily.   30 tablet   0   . Fluticasone-Salmeterol (ADVAIR) 100-50 MCG/DOSE AEPB   Inhalation   Inhale 1 puff into the lungs every 12 (twelve) hours.         Marland Kitchen omeprazole (PRILOSEC) 20 MG capsule   Oral   Take 20 mg by mouth every morning. Before breakfast         . oxybutynin (DITROPAN) 5 MG tablet   Oral   Take 2.5 mg by mouth 3 (three) times daily.          . potassium chloride SA (K-DUR,KLOR-CON) 20 MEQ tablet   Oral   Take 20 mEq by mouth 2 (two) times daily.         Marland Kitchen  predniSONE (DELTASONE) 10 MG tablet   Oral   Take 5 mg by mouth every other day.          . triamterene-hydrochlorothiazide (MAXZIDE-25) 37.5-25 MG per tablet   Oral   Take 1 tablet by mouth every morning.           BP 144/57  Pulse 89  Temp(Src) 98.8 F (37.1 C)  Resp 32  SpO2 95%  Physical Exam  Nursing notes reviewed.  Electronic medical record reviewed. VITAL SIGNS:   Filed Vitals:   04/22/13 0554 04/22/13 0611 04/22/13 0645  BP: 144/57    Pulse: 89    Temp: 98.8 F (37.1 C)    Resp: 32    SpO2: 98% 95% 95%   CONSTITUTIONAL: Awake, oriented, appears non-toxic, thin, elderly AAF HENT: Atraumatic, normocephalic, oral mucosa pink and moist, airway patent. Nares patent without drainage. External ears normal. EYES: Conjunctiva clear, EOMI, PERRLA NECK: Trachea midline, non-tender, supple CARDIOVASCULAR: Normal heart rate, Normal rhythm, No murmurs, rubs, gallops PULMONARY/CHEST: Poor air movement bilaterally, expiratory wheezing. Non-tender. ABDOMINAL: Non-distended, soft, non-tender - no rebound or guarding.  BS normal. NEUROLOGIC: Non-focal, moving all four extremities, no gross  sensory or motor deficits. EXTREMITIES: No clubbing, cyanosis, or edema. Right knee has no effusion, tender to palpation. Pain over the right trochanteric bursa to palpation. SKIN: Warm, Dry, No erythema, No rash  ED Course  Procedures (including critical care time)  Date: 04/22/2013  Rate: 87  Rhythm: normal sinus rhythm  QRS Axis: normal  Intervals: normal  ST/T Wave abnormalities: normal  Conduction Disutrbances: none  Narrative Interpretation: LVH, no significant change when compared with prior EKG from 02/01/2013, nonischemic EKG  Labs Reviewed  CBC WITH DIFFERENTIAL - Abnormal; Notable for the following:    RBC 3.23 (*)    Hemoglobin 8.2 (*)    HCT 25.7 (*)    MCH 25.4 (*)    RDW 16.2 (*)    Neutrophils Relative 42 (*)    Eosinophils Relative 23 (*)    Eosinophils Absolute 1.7 (*)    All other components within normal limits  POCT I-STAT, CHEM 8 - Abnormal; Notable for the following:    Potassium 3.3 (*)    BUN 27 (*)    Creatinine, Ser 1.30 (*)    Hemoglobin 8.8 (*)    HCT 26.0 (*)    All other components within normal limits  POCT I-STAT TROPONIN I   Dg Chest 2 View (if Patient Has Fever And/or Copd)  04/22/2013  *RADIOLOGY REPORT*  Clinical Data: 79 year old female with increasing shortness of breath and difficulty breathing.  CHEST - 2 VIEW  Comparison: 02/01/2013 and earlier.  Findings: Semi upright AP and lateral views of the chest.  Mildly lower lung volumes.  Chronic elevation of the left hemidiaphragm. Stable cardiac size and mediastinal contours.  No pneumothorax or pulmonary edema.  Skin fold artifact in the right chest.  No pneumonia or acute pulmonary opacity. Stable visualized osseous structures.  IMPRESSION: No acute cardiopulmonary abnormality.   Original Report Authenticated By: Erskine Speed, M.D.      1. DYSPNEA   2. Chronic anemia   3. Eosinophilia   4. COPD exacerbation     Medications  albuterol (PROVENTIL) (5 MG/ML) 0.5% nebulizer solution 2.5  mg (not administered)  ipratropium (ATROVENT) nebulizer solution 0.5 mg (not administered)  albuterol (PROVENTIL HFA;VENTOLIN HFA) 108 (90 BASE) MCG/ACT inhaler 2 puff (not administered)  loratadine (CLARITIN) tablet 10 mg (not administered)  albuterol (PROVENTIL) (  5 MG/ML) 0.5% nebulizer solution 5 mg (5 mg Nebulization Given 04/22/13 0645)  predniSONE (DELTASONE) tablet 60 mg (60 mg Oral Given 04/22/13 0702)     MDM  79 year old woman with COPD present with shortness of breath. Patient has an eosinophilia is likely an allergic component to this as well, will put her on a steroid burst, she has the appropriate medications to manage her COPD at home, patient feels much better after a single DuoNeb treatment. Patient saturating well on room air, she is not tachypneic on my reexamination, here no wheezing with good air movement. We'll also add Claritin to her regimen until upon constant drop, she developed a primary care physician Dr. Valentina Lucks in 2 days.  Patient had no chest pain, did evaluate EKG and troponin and his shortness of breath was anginal equivalent-I have a very low suspicion for acute coronary syndrome at this point. All of her cardiac tests are unremarkable.  I explained the diagnosis and have given explicit precautions to return to the ER including worsening shortness of breath, chest pain or any other new or worsening symptoms. The patient understands and accepts the medical plan as it's been dictated and I have answered their questions. Discharge instructions concerning home care and prescriptions have been given.  The patient is STABLE and is discharged to home in good condition.          Jones Skene, MD 04/22/13 0800

## 2013-04-22 NOTE — ED Notes (Signed)
Pt c/o increased difficulty breathing for a "while" worse tonight; audible wheezing; sats 98% in triage; hoarse; resps 32

## 2013-05-16 ENCOUNTER — Emergency Department (HOSPITAL_COMMUNITY)
Admission: EM | Admit: 2013-05-16 | Discharge: 2013-05-16 | Disposition: A | Discharge status: Home / Self Care | Payer: Medicare Other | Attending: Emergency Medicine | Admitting: Emergency Medicine

## 2013-05-16 ENCOUNTER — Emergency Department (HOSPITAL_COMMUNITY): Payer: Medicare Other

## 2013-05-16 ENCOUNTER — Encounter (HOSPITAL_COMMUNITY): Payer: Self-pay | Admitting: *Deleted

## 2013-05-16 DIAGNOSIS — J45901 Unspecified asthma with (acute) exacerbation: Secondary | ICD-10-CM | POA: Insufficient documentation

## 2013-05-16 DIAGNOSIS — R0600 Dyspnea, unspecified: Secondary | ICD-10-CM

## 2013-05-16 DIAGNOSIS — IMO0002 Reserved for concepts with insufficient information to code with codable children: Secondary | ICD-10-CM | POA: Insufficient documentation

## 2013-05-16 DIAGNOSIS — R05 Cough: Secondary | ICD-10-CM | POA: Insufficient documentation

## 2013-05-16 DIAGNOSIS — Z7982 Long term (current) use of aspirin: Secondary | ICD-10-CM | POA: Insufficient documentation

## 2013-05-16 DIAGNOSIS — J441 Chronic obstructive pulmonary disease with (acute) exacerbation: Secondary | ICD-10-CM | POA: Insufficient documentation

## 2013-05-16 DIAGNOSIS — I10 Essential (primary) hypertension: Secondary | ICD-10-CM | POA: Insufficient documentation

## 2013-05-16 DIAGNOSIS — Z79899 Other long term (current) drug therapy: Secondary | ICD-10-CM | POA: Insufficient documentation

## 2013-05-16 DIAGNOSIS — Z853 Personal history of malignant neoplasm of breast: Secondary | ICD-10-CM | POA: Insufficient documentation

## 2013-05-16 DIAGNOSIS — R059 Cough, unspecified: Secondary | ICD-10-CM | POA: Insufficient documentation

## 2013-05-16 LAB — POCT I-STAT, CHEM 8
BUN: 29 mg/dL — ABNORMAL HIGH (ref 6–23)
Calcium, Ion: 1.09 mmol/L — ABNORMAL LOW (ref 1.13–1.30)
Chloride: 102 mEq/L (ref 96–112)
Creatinine, Ser: 1.6 mg/dL — ABNORMAL HIGH (ref 0.50–1.10)
Glucose, Bld: 93 mg/dL (ref 70–99)
HCT: 24 % — ABNORMAL LOW (ref 36.0–46.0)
Hemoglobin: 8.2 g/dL — ABNORMAL LOW (ref 12.0–15.0)
Potassium: 3.9 mEq/L (ref 3.5–5.1)
Sodium: 137 mEq/L (ref 135–145)
TCO2: 28 mmol/L (ref 0–100)

## 2013-05-16 LAB — TROPONIN I: Troponin I: <0.3 ng/mL (ref <0.30)

## 2013-05-16 MED ORDER — OXYCODONE-ACETAMINOPHEN 5-325 MG PO TABS: 1.0000 | ORAL_TABLET | ORAL | Status: DC | PRN

## 2013-05-16 MED ORDER — IPRATROPIUM BROMIDE 0.02 % IN SOLN
RESPIRATORY_TRACT | Status: AC
  Filled 2013-05-16: qty 2.5

## 2013-05-16 MED ORDER — ONDANSETRON HCL 4 MG/2ML IJ SOLN: 4.0000 mg | Freq: Once | INTRAMUSCULAR | Status: DC

## 2013-05-16 MED ORDER — ALBUTEROL SULFATE HFA 108 (90 BASE) MCG/ACT IN AERS
2.0000 | INHALATION_SPRAY | Freq: Once | RESPIRATORY_TRACT | Status: AC
  Administered 2013-05-16: 2 via RESPIRATORY_TRACT
  Filled 2013-05-16: qty 6.7

## 2013-05-16 MED ORDER — IPRATROPIUM BROMIDE 0.02 % IN SOLN
0.5000 mg | Freq: Once | RESPIRATORY_TRACT | Status: AC
  Administered 2013-05-16: 0.5 mg via RESPIRATORY_TRACT

## 2013-05-16 MED ORDER — ALBUTEROL SULFATE (5 MG/ML) 0.5% IN NEBU
5.0000 mg | INHALATION_SOLUTION | Freq: Once | RESPIRATORY_TRACT | Status: AC
  Administered 2013-05-16: 5 mg via RESPIRATORY_TRACT
  Filled 2013-05-16: qty 1

## 2013-05-16 MED ORDER — TRAMADOL HCL 50 MG PO TABS: 50.0000 mg | ORAL_TABLET | Freq: Four times a day (QID) | ORAL | Status: DC | PRN

## 2013-05-16 MED ORDER — ALBUTEROL SULFATE (5 MG/ML) 0.5% IN NEBU: 5.0000 mg | INHALATION_SOLUTION | Freq: Once | RESPIRATORY_TRACT | Status: DC

## 2013-05-16 MED ORDER — IPRATROPIUM BROMIDE HFA 17 MCG/ACT IN AERS: 2.0000 | INHALATION_SPRAY | Freq: Once | RESPIRATORY_TRACT | Status: DC

## 2013-05-16 MED ORDER — FENTANYL CITRATE 0.05 MG/ML IJ SOLN: 50.0000 ug | Freq: Once | INTRAMUSCULAR | Status: DC

## 2013-05-16 NOTE — ED Notes (Signed)
Pt presents to ed with c/o SOB. Pt sts has "difficulty breathing most of the time but today was really bad". Pt is 97% on room air, lying in bed in NAD.

## 2013-05-16 NOTE — ED Notes (Signed)
Pt sts used nebulizer prior to arrival to ED with some relief. Pt and family stating they "don't want any unnecessary tests done" while pt is in the ED.

## 2013-05-16 NOTE — ED Provider Notes (Signed)
History    89yF with sob. Chronic, but worse in last couple days. Occasional cough. No fever or chills. Denies cp. No n/v or diaphoresis. No unusual leg pain or swelling. Has albuterol mdi but spacer wont fit pump so didn't try. No sick contacts.  CSN: [id number]  Arrival date & time 05/16/13  1826   First MD Initiated Contact with Patient 05/16/13 1912      No chief complaint on file.   (Consider location/radiation/quality/duration/timing/severity/associated sxs/prior treatment) HPI  Past Medical History  Diagnosis Date  . Asthma   . Hypertension   . COPD (chronic obstructive pulmonary disease)   . Breast CA     Past Surgical History  Procedure Laterality Date  . Vaginal hysterectomy      Family History  Problem Relation Age of Onset  . Diabetes Sister   . Diabetes Sister     History  Substance Use Topics  . Smoking status: Never Smoker   . Smokeless tobacco: Never Used  . Alcohol Use: No    OB History   Grav Para Term Preterm Abortions TAB SAB Ect Mult Living                  Review of Systems  All systems reviewed and negative, other than as noted in HPI.   Allergies  Review of patient's allergies indicates no known allergies.  Home Medications   Current Outpatient Rx  Name  Route  Sig  Dispense  Refill  . albuterol (PROAIR HFA) 108 (90 BASE) MCG/ACT inhaler   Inhalation   Inhale 2 puffs into the lungs every 4 (four) hours as needed. For wheezing or shortness of breath         . amLODipine (NORVASC) 5 MG tablet   Oral   Take 5 mg by mouth every morning.          Marland Kitchen aspirin EC 81 MG tablet   Oral   Take 81 mg by mouth every morning.          . ferrous sulfate 325 (65 FE) MG tablet   Oral   Take 1 tablet (325 mg total) by mouth daily.   30 tablet   0   . Fluticasone-Salmeterol (ADVAIR) 100-50 MCG/DOSE AEPB   Inhalation   Inhale 1 puff into the lungs every 12 (twelve) hours.         Marland Kitchen loratadine (CLARITIN) 10 MG tablet   Oral    Take 1 tablet (10 mg total) by mouth daily.   30 tablet   0   . oxybutynin (DITROPAN) 5 MG tablet   Oral   Take 2.5 mg by mouth 3 (three) times daily.          . potassium chloride SA (K-DUR,KLOR-CON) 20 MEQ tablet   Oral   Take 20 mEq by mouth daily.          . predniSONE (DELTASONE) 10 MG tablet   Oral   Take 5 mg by mouth every other day.          . predniSONE (DELTASONE) 50 MG tablet   Oral   Take 1 tablet (50 mg total) by mouth daily.   4 tablet   0   . triamterene-hydrochlorothiazide (MAXZIDE-25) 37.5-25 MG per tablet   Oral   Take 1 tablet by mouth every morning.           BP 134/64  Pulse 67  Temp(Src) 98.3 F (36.8 C) (Oral)  Resp 18  SpO2 98%  Physical Exam  Nursing note and vitals reviewed. Constitutional: She appears well-developed and well-nourished. No distress.  HENT:  Head: Normocephalic and atraumatic.  Eyes: Conjunctivae are normal. Right eye exhibits no discharge. Left eye exhibits no discharge.  Neck: Neck supple.  Cardiovascular: Normal rate, regular rhythm and normal heart sounds.  Exam reveals no gallop and no friction rub.   No murmur heard. Pulmonary/Chest: Effort normal and breath sounds normal. No respiratory distress. She exhibits no tenderness.  Abdominal: Soft. She exhibits no distension. There is no tenderness.  Musculoskeletal: She exhibits no edema and no tenderness.  Lower extremities symmetric as compared to each other. No calf tenderness. Negative Homan's. No palpable cords.   Neurological: She is alert.  Skin: Skin is warm and dry.  Psychiatric: She has a normal mood and affect. Her behavior is normal. Thought content normal.    ED Course  Procedures (including critical care time)  Labs Reviewed  POCT I-STAT, CHEM 8 - Abnormal; Notable for the following:    BUN 29 (*)    Creatinine, Ser 1.60 (*)    Calcium, Ion 1.09 (*)    Hemoglobin 8.2 (*)    HCT 24.0 (*)    All other components within normal limits   TROPONIN I   Dg Chest 2 View  05/16/2013   *RADIOLOGY REPORT*  Clinical Data: Shortness of breath.  CHEST - 2 VIEW  Comparison: 04/22/2013 and 02/01/2013 radiographs.  Findings: There is chronic elevation or eventration of the left hemidiaphragm.  The heart size and mediastinal contours are stable. There is chronic left lower lobe atelectasis.  No confluent airspace opacity, pleural effusion or pneumothorax is demonstrated. The subacromial space of both shoulders is obliterated consistent with chronic rotator cuff tears.  IMPRESSION: Stable examination with chronic left basilar atelectasis.  No acute cardiopulmonary process.   Original Report Authenticated By: Carey Bullocks, M.D.   EKG: initial Rhythm: normal sinus Vent. rate 83 BPM PR interval 128 ms QRS duration 78 ms QT/QTc 388/456 ms LVH ST segments: NS ST changes  EKG: repeat Rhythm: normal sinus Vent. rate 90 BPM PR interval 148 ms QRS duration 84 ms QT/QTc 408/499 ms ST segments: NS ST changes Comparison: little interval change     1. Dyspnea       MDM  89yF with dyspnea. Seems chronic nature. No distress on my examination. CXR w/o focal abnormality. EKGs stable. Low suspicion for emergent etiology of presenting complaint.        Raeford Razor, MD 05/20/13 [phone]

## 2013-05-16 NOTE — ED Notes (Signed)
Patient transported to X-ray 

## 2013-05-21 ENCOUNTER — Emergency Department (HOSPITAL_COMMUNITY): Payer: Medicare Other

## 2013-05-21 ENCOUNTER — Emergency Department (HOSPITAL_COMMUNITY)
Admission: EM | Admit: 2013-05-21 | Discharge: 2013-05-21 | Disposition: A | Discharge status: Home / Self Care | Payer: Medicare Other | Attending: Emergency Medicine | Admitting: Emergency Medicine

## 2013-05-21 ENCOUNTER — Encounter (HOSPITAL_COMMUNITY): Payer: Self-pay | Admitting: Emergency Medicine

## 2013-05-21 DIAGNOSIS — J449 Chronic obstructive pulmonary disease, unspecified: Secondary | ICD-10-CM | POA: Insufficient documentation

## 2013-05-21 DIAGNOSIS — J45909 Unspecified asthma, uncomplicated: Secondary | ICD-10-CM | POA: Insufficient documentation

## 2013-05-21 DIAGNOSIS — J4489 Other specified chronic obstructive pulmonary disease: Secondary | ICD-10-CM | POA: Insufficient documentation

## 2013-05-21 DIAGNOSIS — R079 Chest pain, unspecified: Secondary | ICD-10-CM | POA: Insufficient documentation

## 2013-05-21 DIAGNOSIS — I10 Essential (primary) hypertension: Secondary | ICD-10-CM | POA: Insufficient documentation

## 2013-05-21 DIAGNOSIS — IMO0002 Reserved for concepts with insufficient information to code with codable children: Secondary | ICD-10-CM | POA: Insufficient documentation

## 2013-05-21 DIAGNOSIS — Z79899 Other long term (current) drug therapy: Secondary | ICD-10-CM | POA: Insufficient documentation

## 2013-05-21 DIAGNOSIS — Z853 Personal history of malignant neoplasm of breast: Secondary | ICD-10-CM | POA: Insufficient documentation

## 2013-05-21 DIAGNOSIS — R002 Palpitations: Secondary | ICD-10-CM | POA: Insufficient documentation

## 2013-05-21 DIAGNOSIS — Z7982 Long term (current) use of aspirin: Secondary | ICD-10-CM | POA: Insufficient documentation

## 2013-05-21 LAB — POCT I-STAT TROPONIN I: Troponin i, poc: 0.03 ng/mL (ref 0.00–0.08)

## 2013-05-21 LAB — BASIC METABOLIC PANEL WITH GFR
BUN: 35 mg/dL — ABNORMAL HIGH (ref 6–23)
CO2: 28 meq/L (ref 19–32)
Calcium: 9.1 mg/dL (ref 8.4–10.5)
Chloride: 101 meq/L (ref 96–112)
Creatinine, Ser: 1.57 mg/dL — ABNORMAL HIGH (ref 0.50–1.10)
GFR calc Af Amer: 32 mL/min — ABNORMAL LOW
GFR calc non Af Amer: 27 mL/min — ABNORMAL LOW
Glucose, Bld: 112 mg/dL — ABNORMAL HIGH (ref 70–99)
Potassium: 4 meq/L (ref 3.5–5.1)
Sodium: 138 meq/L (ref 135–145)

## 2013-05-21 LAB — T4, FREE: Free T4: 1.5 ng/dL (ref 0.80–1.80)

## 2013-05-21 LAB — CBC
HCT: 23.5 % — ABNORMAL LOW (ref 36.0–46.0)
Hemoglobin: 7.7 g/dL — ABNORMAL LOW (ref 12.0–15.0)
MCH: 25.7 pg — ABNORMAL LOW (ref 26.0–34.0)
MCHC: 32.8 g/dL (ref 30.0–36.0)
MCV: 78.3 fL (ref 78.0–100.0)
Platelets: 409 K/uL — ABNORMAL HIGH (ref 150–400)
RBC: 3 MIL/uL — ABNORMAL LOW (ref 3.87–5.11)
RDW: 14.2 % (ref 11.5–15.5)
WBC: 6.5 K/uL (ref 4.0–10.5)

## 2013-05-21 NOTE — ED Notes (Signed)
Pt states she was sitting in bed and began having palpitations followed by chest tightness. Pt denies any pain and states this has never happened before.

## 2013-05-21 NOTE — ED Provider Notes (Signed)
History     CSN: [id number]  Arrival date & time 05/21/13  1244   First MD Initiated Contact with Patient 05/21/13 1255      Chief Complaint  Patient presents with  . Palpitations  . Chest Pain     HPI Patient reports sitting in bed this morning she began having a fluttering sensation in her chest that lasted for a few seconds.  This was associated with chest tightness.  She has no prior history of atrial fibrillation.  She had no severe near-syncope associated with it.  She's never had this happen before.  She has no known history of coronary disease.  She has a history of anemia and chronic renal insufficiency as well as hypertension and asthma.  This time she has no complaints.  No recent exertional shortness of breath.  She's not had her thyroid studies checked recently.  She denies increasing or decreasing of her medications.  She denies caffeine intake but does however report increased chocolate use lately.  No fevers or chills.  No melena hematochezia.  No other complaints.   Past Medical History  Diagnosis Date  . Asthma   . Hypertension   . COPD (chronic obstructive pulmonary disease)   . Breast CA     Past Surgical History  Procedure Laterality Date  . Vaginal hysterectomy      Family History  Problem Relation Age of Onset  . Diabetes Sister   . Diabetes Sister     History  Substance Use Topics  . Smoking status: Never Smoker   . Smokeless tobacco: Never Used  . Alcohol Use: No    OB History   Grav Para Term Preterm Abortions TAB SAB Ect Mult Living                  Review of Systems  All other systems reviewed and are negative.    Allergies  Review of patient's allergies indicates no known allergies.  Home Medications   Current Outpatient Rx  Name  Route  Sig  Dispense  Refill  . albuterol (PROAIR HFA) 108 (90 BASE) MCG/ACT inhaler   Inhalation   Inhale 2 puffs into the lungs every 4 (four) hours as needed. For wheezing or shortness of  breath         . amLODipine (NORVASC) 5 MG tablet   Oral   Take 5 mg by mouth every morning.          Marland Kitchen aspirin EC 81 MG tablet   Oral   Take 81 mg by mouth every morning.          . ferrous sulfate 325 (65 FE) MG tablet   Oral   Take 325 mg by mouth every morning.         . Fluticasone-Salmeterol (ADVAIR) 100-50 MCG/DOSE AEPB   Inhalation   Inhale 1 puff into the lungs every 12 (twelve) hours.         Marland Kitchen loratadine (CLARITIN) 10 MG tablet   Oral   Take 10 mg by mouth every morning.         Marland Kitchen oxyCODONE-acetaminophen (PERCOCET/ROXICET) 5-325 MG per tablet   Oral   Take 1-2 tablets by mouth every 4 (four) hours as needed for pain.         . potassium chloride SA (K-DUR,KLOR-CON) 20 MEQ tablet   Oral   Take 20 mEq by mouth every morning.          . predniSONE (DELTASONE) 10 MG  tablet   Oral   Take 5 mg by mouth every other day.          . traMADol (ULTRAM) 50 MG tablet   Oral   Take 50 mg by mouth every 6 (six) hours as needed for pain.         Marland Kitchen triamterene-hydrochlorothiazide (MAXZIDE-25) 37.5-25 MG per tablet   Oral   Take 1 tablet by mouth every morning.         Marland Kitchen oxybutynin (DITROPAN) 5 MG tablet   Oral   Take 2.5 mg by mouth 3 (three) times daily.            BP 150/67  Pulse 77  Resp 24  SpO2 98%  Physical Exam  Nursing note and vitals reviewed. Constitutional: She is oriented to person, place, and time. She appears well-developed and well-nourished. No distress.  HENT:  Head: Normocephalic and atraumatic.  Eyes: EOM are normal.  Neck: Normal range of motion.  Cardiovascular: Normal rate, regular rhythm and normal heart sounds.   Pulmonary/Chest: Effort normal and breath sounds normal.  Abdominal: Soft. She exhibits no distension. There is no tenderness.  Musculoskeletal: Normal range of motion.  Neurological: She is alert and oriented to person, place, and time.  Skin: Skin is warm and dry.  Psychiatric: She has a normal  mood and affect. Judgment normal.    ED Course  Procedures (including critical care time)   Date: 05/21/2013  Rate: 79  Rhythm: normal sinus rhythm  QRS Axis: normal  Intervals: normal  ST/T Wave abnormalities: normal  Conduction Disutrbances: none  Narrative Interpretation:   Old EKG Reviewed: No significant changes noted     Labs Reviewed  CBC - Abnormal; Notable for the following:    RBC 3.00 (*)    Hemoglobin 7.7 (*)    HCT 23.5 (*)    MCH 25.7 (*)    Platelets 409 (*)    All other components within normal limits  BASIC METABOLIC PANEL - Abnormal; Notable for the following:    Glucose, Bld 112 (*)    BUN 35 (*)    Creatinine, Ser 1.57 (*)    GFR calc non Af Amer 27 (*)    GFR calc Af Amer 32 (*)    All other components within normal limits  TSH  T4, FREE  POCT I-STAT TROPONIN I   Dg Chest 2 View  05/21/2013   *RADIOLOGY REPORT*  Clinical Data: Palpitations and chest pain.  CHEST - 2 VIEW  Comparison: 05/16/2013  Findings: Stable chronic lung disease and elevation of the left hemidiaphragm.  No edema, consolidation or pleural fluid is identified.  Heart size is within normal limits.  IMPRESSION: No active disease.  Stable elevation of the left hemidiaphragm.   Original Report Authenticated By: Irish Lack, M.D.   I personally reviewed the imaging tests through PACS system I reviewed available ER/hospitalization records through the EMR   1. Palpitations       MDM  Patient presents with transient palpitations this morning with associated chest tightness.  This lasted for a few seconds and then resolved.  She's no longer had any chest tightness or palpitations since then.  Troponin normal.  Chronic renal insufficiency consistent with her priors.  The patient has anemia but she has a long-standing history of anemia at and review the chart.  Her hemoglobin is not significantly changed from prior.  Please see chart for complete details of prior hemoglobins.  The  patient will need  cardiology followup as an outpatient likely Holter monitoring.  This was expressed to the patient and her family.  They're agreeable to outpatient plan.  Referral to equal cardiology.  She understands to return to the emergency department for new or worsening symptoms.           Lyanne Co, MD 05/21/13 [phone]

## 2013-08-21 ENCOUNTER — Emergency Department (HOSPITAL_COMMUNITY)
Admission: EM | Admit: 2013-08-21 | Discharge: 2013-08-21 | Disposition: A | Discharge status: Home / Self Care | Payer: Medicare Other | Source: Home / Self Care | Attending: Family Medicine | Admitting: Family Medicine

## 2013-08-21 ENCOUNTER — Emergency Department (INDEPENDENT_AMBULATORY_CARE_PROVIDER_SITE_OTHER): Payer: Medicare Other

## 2013-08-21 ENCOUNTER — Encounter (HOSPITAL_COMMUNITY): Payer: Self-pay | Admitting: *Deleted

## 2013-08-21 DIAGNOSIS — M79671 Pain in right foot: Secondary | ICD-10-CM

## 2013-08-21 DIAGNOSIS — M79609 Pain in unspecified limb: Secondary | ICD-10-CM

## 2013-08-21 MED ORDER — TRAMADOL HCL 50 MG PO TABS: 50.0000 mg | ORAL_TABLET | Freq: Four times a day (QID) | ORAL | Status: DC | PRN

## 2013-08-21 NOTE — ED Provider Notes (Signed)
CSN: [id number]     Arrival date & time 08/21/13  1050 History   First MD Initiated Contact with Patient 08/21/13 1103     Chief Complaint  Patient presents with  . Foot Pain   (Consider location/radiation/quality/duration/timing/severity/associated sxs/prior Treatment) Patient is a 79 y.o. female presenting with lower extremity pain. The history is provided by the patient and a friend.  Foot Pain This is a new problem. The current episode started 3 to 5 hours ago. The problem occurs constantly. The problem has not changed since onset.Pertinent negatives include no chest pain and no abdominal pain. Associated symptoms comments: Right sided body pain.. Nothing aggravates the symptoms.    Past Medical History  Diagnosis Date  . Asthma   . Hypertension   . COPD (chronic obstructive pulmonary disease)   . Breast CA    Past Surgical History  Procedure Laterality Date  . Vaginal hysterectomy     Family History  Problem Relation Age of Onset  . Diabetes Sister   . Diabetes Sister    History  Substance Use Topics  . Smoking status: Never Smoker   . Smokeless tobacco: Never Used  . Alcohol Use: No   OB History   Grav Para Term Preterm Abortions TAB SAB Ect Mult Living                 Review of Systems  Constitutional: Negative.   Cardiovascular: Negative for chest pain.  Gastrointestinal: Negative for abdominal pain.  Musculoskeletal: Positive for joint swelling and gait problem.  Skin: Negative.     Allergies  Review of patient's allergies indicates no known allergies.  Home Medications   Current Outpatient Rx  Name  Route  Sig  Dispense  Refill  . albuterol (PROAIR HFA) 108 (90 BASE) MCG/ACT inhaler   Inhalation   Inhale 2 puffs into the lungs every 4 (four) hours as needed. For wheezing or shortness of breath         . amLODipine (NORVASC) 5 MG tablet   Oral   Take 5 mg by mouth every morning.          Marland Kitchen aspirin EC 81 MG tablet   Oral   Take 81 mg by  mouth every morning.          . ferrous sulfate 325 (65 FE) MG tablet   Oral   Take 325 mg by mouth every morning.         . Fluticasone-Salmeterol (ADVAIR) 100-50 MCG/DOSE AEPB   Inhalation   Inhale 1 puff into the lungs every 12 (twelve) hours.         Marland Kitchen loratadine (CLARITIN) 10 MG tablet   Oral   Take 10 mg by mouth every morning.         Marland Kitchen oxybutynin (DITROPAN) 5 MG tablet   Oral   Take 2.5 mg by mouth 3 (three) times daily.          Marland Kitchen oxyCODONE-acetaminophen (PERCOCET/ROXICET) 5-325 MG per tablet   Oral   Take 1-2 tablets by mouth every 4 (four) hours as needed for pain.         . potassium chloride SA (K-DUR,KLOR-CON) 20 MEQ tablet   Oral   Take 20 mEq by mouth every morning.          . predniSONE (DELTASONE) 10 MG tablet   Oral   Take 5 mg by mouth every other day.          . traMADol (ULTRAM) 50  MG tablet   Oral   Take 50 mg by mouth every 6 (six) hours as needed for pain.         . traMADol (ULTRAM) 50 MG tablet   Oral   Take 1 tablet (50 mg total) by mouth every 6 (six) hours as needed for pain.   30 tablet   0   . triamterene-hydrochlorothiazide (MAXZIDE-25) 37.5-25 MG per tablet   Oral   Take 1 tablet by mouth every morning.          BP 120/64  Pulse 97  Temp(Src) 98.5 F (36.9 C) (Oral)  Resp 18  SpO2 100% Physical Exam  Nursing note and vitals reviewed. Constitutional: She appears well-developed and well-nourished.  Musculoskeletal: She exhibits tenderness.       Right foot: She exhibits bony tenderness, swelling and decreased capillary refill. She exhibits normal range of motion, no tenderness and no deformity.  Neurological: She is alert.  Skin: Skin is warm and dry.    ED Course  Procedures (including critical care time) Labs Review Labs Reviewed - No data to display Imaging Review Dg Foot Complete Right  08/21/2013   *RADIOLOGY REPORT*  Clinical Data: Chronic right foot pain.  RIGHT FOOT COMPLETE - 3+ VIEW   Comparison: No priors.  Findings: Three views of the right foot demonstrate no acute displaced fracture, subluxation, dislocation, joint or soft tissue abnormality.  Bones appear mildly osteopenic. Pes planus.  IMPRESSION: 1.  No acute radiographic abnormality of the right foot. 2.  Pes planus. 3.  Mild osteopenia.   Original Report Authenticated By: Trudie Reed, M.D.    MDM  X-rays reviewed and report per radiologist.     Linna Hoff, MD 08/21/13 [phone]

## 2013-08-21 NOTE — ED Notes (Addendum)
Doppler dorsalis pedis pulse obtained in pt's rt foot from base of ankle to distal metatarsal @ appx 90 bpm.

## 2013-08-21 NOTE — ED Notes (Signed)
Pt  Reports  r  Foot        Pain        And pain  r   Side of  Body           For  Quite  A  While         She  Reports       Pain     Worse     Today    Pt  denys  Any  specefic  Injury      Pt  Is  Sitting  Upright  In  Wheelchair

## 2013-08-23 ENCOUNTER — Encounter (HOSPITAL_COMMUNITY): Payer: Self-pay | Admitting: *Deleted

## 2013-08-23 ENCOUNTER — Emergency Department (HOSPITAL_COMMUNITY): Payer: Medicare Other

## 2013-08-23 ENCOUNTER — Emergency Department (HOSPITAL_COMMUNITY)
Admission: EM | Admit: 2013-08-23 | Discharge: 2013-08-23 | Disposition: A | Discharge status: Home / Self Care | Payer: Medicare Other | Attending: Emergency Medicine | Admitting: Emergency Medicine

## 2013-08-23 DIAGNOSIS — Z853 Personal history of malignant neoplasm of breast: Secondary | ICD-10-CM | POA: Insufficient documentation

## 2013-08-23 DIAGNOSIS — K5900 Constipation, unspecified: Secondary | ICD-10-CM | POA: Insufficient documentation

## 2013-08-23 DIAGNOSIS — Z7982 Long term (current) use of aspirin: Secondary | ICD-10-CM | POA: Insufficient documentation

## 2013-08-23 DIAGNOSIS — J45909 Unspecified asthma, uncomplicated: Secondary | ICD-10-CM | POA: Insufficient documentation

## 2013-08-23 DIAGNOSIS — I10 Essential (primary) hypertension: Secondary | ICD-10-CM | POA: Insufficient documentation

## 2013-08-23 DIAGNOSIS — R6883 Chills (without fever): Secondary | ICD-10-CM | POA: Insufficient documentation

## 2013-08-23 DIAGNOSIS — K8020 Calculus of gallbladder without cholecystitis without obstruction: Secondary | ICD-10-CM | POA: Insufficient documentation

## 2013-08-23 DIAGNOSIS — Z90710 Acquired absence of both cervix and uterus: Secondary | ICD-10-CM | POA: Insufficient documentation

## 2013-08-23 DIAGNOSIS — J4489 Other specified chronic obstructive pulmonary disease: Secondary | ICD-10-CM | POA: Insufficient documentation

## 2013-08-23 DIAGNOSIS — Z79899 Other long term (current) drug therapy: Secondary | ICD-10-CM | POA: Insufficient documentation

## 2013-08-23 DIAGNOSIS — IMO0002 Reserved for concepts with insufficient information to code with codable children: Secondary | ICD-10-CM | POA: Insufficient documentation

## 2013-08-23 DIAGNOSIS — J449 Chronic obstructive pulmonary disease, unspecified: Secondary | ICD-10-CM | POA: Insufficient documentation

## 2013-08-23 LAB — COMPREHENSIVE METABOLIC PANEL
ALT: 11 U/L (ref 0–35)
AST: 22 U/L (ref 0–37)
Albumin: 3.2 g/dL — ABNORMAL LOW (ref 3.5–5.2)
Alkaline Phosphatase: 53 U/L (ref 39–117)
Chloride: 96 mEq/L (ref 96–112)
Potassium: 3.3 mEq/L — ABNORMAL LOW (ref 3.5–5.1)
Sodium: 136 mEq/L (ref 135–145)
Total Bilirubin: 0.5 mg/dL (ref 0.3–1.2)
Total Protein: 6.7 g/dL (ref 6.0–8.3)

## 2013-08-23 LAB — CBC WITH DIFFERENTIAL/PLATELET
Basophils Relative: 0 % (ref 0–1)
Eosinophils Absolute: 1.5 10*3/uL — ABNORMAL HIGH (ref 0.0–0.7)
Hemoglobin: 11.9 g/dL — ABNORMAL LOW (ref 12.0–15.0)
MCH: 29.4 pg (ref 26.0–34.0)
MCHC: 34.4 g/dL (ref 30.0–36.0)
Monocytes Relative: 8 % (ref 3–12)
Neutro Abs: 3.1 10*3/uL (ref 1.7–7.7)
Neutrophils Relative %: 49 % (ref 43–77)
Platelets: 280 10*3/uL (ref 150–400)
RBC: 4.05 MIL/uL (ref 3.87–5.11)

## 2013-08-23 LAB — URINALYSIS, ROUTINE W REFLEX MICROSCOPIC
Bilirubin Urine: NEGATIVE
Glucose, UA: NEGATIVE mg/dL
Ketones, ur: NEGATIVE mg/dL
Nitrite: NEGATIVE
Specific Gravity, Urine: 1.015 (ref 1.005–1.030)
pH: 7.5 (ref 5.0–8.0)

## 2013-08-23 LAB — CG4 I-STAT (LACTIC ACID): Lactic Acid, Venous: 1.31 mmol/L (ref 0.5–2.2)

## 2013-08-23 LAB — URINE MICROSCOPIC-ADD ON

## 2013-08-23 LAB — POCT I-STAT TROPONIN I: Troponin i, poc: 0 ng/mL (ref 0.00–0.08)

## 2013-08-23 MED ORDER — ONDANSETRON HCL 4 MG/2ML IJ SOLN
4.0000 mg | Freq: Once | INTRAMUSCULAR | Status: AC
  Administered 2013-08-23: 4 mg via INTRAVENOUS
  Filled 2013-08-23: qty 2

## 2013-08-23 MED ORDER — MORPHINE SULFATE 2 MG/ML IJ SOLN
2.0000 mg | Freq: Once | INTRAMUSCULAR | Status: AC
  Administered 2013-08-23: 2 mg via INTRAVENOUS
  Filled 2013-08-23: qty 1

## 2013-08-23 MED ORDER — SODIUM CHLORIDE 0.9 % IV BOLUS (SEPSIS)
500.0000 mL | Freq: Once | INTRAVENOUS | Status: AC
  Administered 2013-08-23: 500 mL via INTRAVENOUS

## 2013-08-23 MED ORDER — IOHEXOL 300 MG/ML  SOLN
100.0000 mL | Freq: Once | INTRAMUSCULAR | Status: AC | PRN
  Administered 2013-08-23: 100 mL via INTRAVENOUS

## 2013-08-23 MED ORDER — IOHEXOL 300 MG/ML  SOLN
50.0000 mL | Freq: Once | INTRAMUSCULAR | Status: AC | PRN
  Administered 2013-08-23: 50 mL via ORAL

## 2013-08-23 NOTE — ED Provider Notes (Signed)
CSN: [id number]     Arrival date & time 08/23/13  1050 History   First MD Initiated Contact with Patient 08/23/13 1111     Chief Complaint  Patient presents with  . Abdominal Pain   (Consider location/radiation/quality/duration/timing/severity/associated sxs/prior Treatment) HPI Comments: 79 year old female presents with a few hours of nausea and left upper quadrant abdominal pain. Started this morning and she knows she was unable to take her pills due to nausea. Has not had any vomiting. She is "always constipated" has not had any new diarrhea. Denied any chest pain, shortness of breath, or cough. Has had urinary frequency for the past 2 weeks but denies any dysuria. No bloody stools or hematemesis. She is unable to describe what the pain feels like or what level it is.  Patient is a 79 y.o. female presenting with abdominal pain. The history is provided by the patient and a relative.  Abdominal Pain Associated symptoms: chills, constipation and nausea   Associated symptoms: no chest pain, no diarrhea, no dysuria, no fever, no shortness of breath and no vomiting     Past Medical History  Diagnosis Date  . Asthma   . Hypertension   . COPD (chronic obstructive pulmonary disease)   . Breast CA    Past Surgical History  Procedure Laterality Date  . Vaginal hysterectomy     Family History  Problem Relation Age of Onset  . Diabetes Sister   . Diabetes Sister    History  Substance Use Topics  . Smoking status: Never Smoker   . Smokeless tobacco: Never Used  . Alcohol Use: No   OB History   Grav Para Term Preterm Abortions TAB SAB Ect Mult Living                 Review of Systems  Constitutional: Positive for chills. Negative for fever.  Respiratory: Negative for shortness of breath.   Cardiovascular: Negative for chest pain.  Gastrointestinal: Positive for nausea, abdominal pain and constipation. Negative for vomiting, diarrhea and blood in stool.  Genitourinary: Positive for  frequency. Negative for dysuria.  Musculoskeletal: Negative for back pain.  All other systems reviewed and are negative.    Allergies  Review of patient's allergies indicates no known allergies.  Home Medications   Current Outpatient Rx  Name  Route  Sig  Dispense  Refill  . albuterol (PROAIR HFA) 108 (90 BASE) MCG/ACT inhaler   Inhalation   Inhale 2 puffs into the lungs every 4 (four) hours as needed for wheezing or shortness of breath.          Marland Kitchen amLODipine (NORVASC) 5 MG tablet   Oral   Take 5 mg by mouth every morning.          Marland Kitchen aspirin EC 81 MG tablet   Oral   Take 81 mg by mouth every morning.          . ferrous sulfate 325 (65 FE) MG tablet   Oral   Take 325 mg by mouth every morning.         . Fluticasone-Salmeterol (ADVAIR) 100-50 MCG/DOSE AEPB   Inhalation   Inhale 1 puff into the lungs every 12 (twelve) hours.         Marland Kitchen omeprazole (PRILOSEC) 20 MG capsule   Oral   Take 20 mg by mouth daily.         Marland Kitchen oxybutynin (DITROPAN) 5 MG tablet   Oral   Take 2.5 mg by mouth 3 (three) times  daily.          . potassium chloride SA (K-DUR,KLOR-CON) 20 MEQ tablet   Oral   Take 20 mEq by mouth daily.          . predniSONE (DELTASONE) 10 MG tablet   Oral   Take 5 mg by mouth every other day.          . traMADol (ULTRAM) 50 MG tablet   Oral   Take 50 mg by mouth every 6 (six) hours as needed for pain.         Marland Kitchen triamterene-hydrochlorothiazide (MAXZIDE-25) 37.5-25 MG per tablet   Oral   Take 1 tablet by mouth every morning.          BP 150/76  Pulse 93  Temp(Src) 98.5 F (36.9 C) (Oral)  Resp 18  SpO2 97% Physical Exam  Nursing note and vitals reviewed. Constitutional: She is oriented to person, place, and time. She appears well-developed and well-nourished.  HENT:  Head: Normocephalic and atraumatic.  Right Ear: External ear normal.  Left Ear: External ear normal.  Nose: Nose normal.  Eyes: Right eye exhibits no discharge. Left  eye exhibits no discharge.  Cardiovascular: Normal rate, regular rhythm and normal heart sounds.   Pulmonary/Chest: Effort normal and breath sounds normal.  Abdominal: Soft. There is tenderness.  Neurological: She is alert and oriented to person, place, and time.  Skin: Skin is warm and dry.    ED Course  Procedures (including critical care time) Labs Review Labs Reviewed  CBC WITH DIFFERENTIAL - Abnormal; Notable for the following:    Hemoglobin 11.9 (*)    HCT 34.6 (*)    Eosinophils Relative 23 (*)    Eosinophils Absolute 1.5 (*)    All other components within normal limits  COMPREHENSIVE METABOLIC PANEL - Abnormal; Notable for the following:    Potassium 3.3 (*)    Creatinine, Ser 1.13 (*)    Albumin 3.2 (*)    GFR calc non Af Amer 41 (*)    GFR calc Af Amer 47 (*)    All other components within normal limits  URINALYSIS, ROUTINE W REFLEX MICROSCOPIC - Abnormal; Notable for the following:    APPearance CLOUDY (*)    Hgb urine dipstick TRACE (*)    Leukocytes, UA TRACE (*)    All other components within normal limits  LIPASE, BLOOD  URINE MICROSCOPIC-ADD ON  POCT I-STAT TROPONIN I  CG4 I-STAT (LACTIC ACID)   Imaging Review Ct Abdomen Pelvis W Contrast  08/23/2013   CLINICAL DATA:  Abdominal pain, nausea.  EXAM: CT ABDOMEN AND PELVIS WITH CONTRAST  TECHNIQUE: Multidetector CT imaging of the abdomen and pelvis was performed using the standard protocol following bolus administration of intravenous contrast.  CONTRAST:  OMNIPAQUE IOHEXOL 300 MG/ML  SOLN  COMPARISON:  None.  FINDINGS: Elevation of the left hemidiaphragm. Lung bases are clear. No pleural effusions. Heart is normal size. Coronary artery and aortic calcifications.  Gallstones are noted within the gallbladder. Small hypodensities in the hepatic dome, most likely small cysts. No biliary ductal dilatation or visible biliary stones. Calcifications with in the spleen compatible with old granulomatous disease.  Pancreas, adrenals are unremarkable. Small cortical cysts in the kidneys bilaterally. Mild cortical thinning bilaterally. No hydronephrosis.  Prior hysterectomy. No adnexal masses. Urinary bladder is unremarkable. Large and small bowel grossly unremarkable. No free fluid, free air or adenopathy.  Aorta and iliac vessels are calcified, non aneurysmal.  Degenerative disc and facet disease in  the lumbar spine. 10 mm of anterolisthesis of L4 on L5 to facet disease.  IMPRESSION: No acute findings in the abdomen or pelvis.  Cholelithiasis.  Chronic changes as above.   Electronically Signed   By: Charlett Nose M.D.   On: 08/23/2013 16:00     Date: 08/23/2013  Rate: 95  Rhythm: normal sinus rhythm  QRS Axis: normal  Intervals: normal  ST/T Wave abnormalities: nonspecific ST/T changes  Conduction Disutrbances:none  Narrative Interpretation:   Old EKG Reviewed: unchanged    MDM   1. Cholelithiasis    Most of her tenderness is in the left upper quadrant, however she is also diffusely tender. Due to this a CT was obtained. CT negative except for cholelithiasis. On re-eval of patient she has no abdominal tenderness. Has no elevated WBC, fever, or elevated LFTs. Discussed with daughter Linton Ham), will have patient f/u with PCP for outpatient w/u of her gallbladder. At this point her pain is resolved and abd exam is benign, and daughter is not sure patient would want a surgery anyway. They prefer outpatient w/u of gallbladder.    Audree Camel, MD 08/23/13 [phone]

## 2013-08-23 NOTE — ED Notes (Signed)
Patient is able to let us know when she needs to use the restroom. Patient does not need to urinate at this time

## 2013-08-23 NOTE — ED Notes (Signed)
Pt states having abdominal pain and nausea, denies vomiting or diarrhea.

## 2013-08-23 NOTE — ED Notes (Signed)
Attempts x 3 to start IV. Charge nurse notified and to attempt.

## 2013-09-01 ENCOUNTER — Emergency Department (HOSPITAL_COMMUNITY): Payer: Medicare Other

## 2013-09-01 ENCOUNTER — Inpatient Hospital Stay (HOSPITAL_COMMUNITY)
Admission: EM | Admit: 2013-09-01 | Discharge: 2013-09-07 | DRG: 190 | Disposition: A | Discharge status: Home Health | Payer: Medicare Other | Attending: Internal Medicine | Admitting: Internal Medicine

## 2013-09-01 ENCOUNTER — Encounter (HOSPITAL_COMMUNITY): Payer: Self-pay

## 2013-09-01 DIAGNOSIS — J40 Bronchitis, not specified as acute or chronic: Secondary | ICD-10-CM

## 2013-09-01 DIAGNOSIS — Z681 Body mass index (BMI) 19 or less, adult: Secondary | ICD-10-CM

## 2013-09-01 DIAGNOSIS — Z79899 Other long term (current) drug therapy: Secondary | ICD-10-CM

## 2013-09-01 DIAGNOSIS — J4521 Mild intermittent asthma with (acute) exacerbation: Secondary | ICD-10-CM

## 2013-09-01 DIAGNOSIS — I10 Essential (primary) hypertension: Secondary | ICD-10-CM | POA: Diagnosis present

## 2013-09-01 DIAGNOSIS — N179 Acute kidney failure, unspecified: Secondary | ICD-10-CM | POA: Diagnosis present

## 2013-09-01 DIAGNOSIS — R29898 Other symptoms and signs involving the musculoskeletal system: Secondary | ICD-10-CM | POA: Diagnosis present

## 2013-09-01 DIAGNOSIS — J209 Acute bronchitis, unspecified: Secondary | ICD-10-CM

## 2013-09-01 DIAGNOSIS — Z853 Personal history of malignant neoplasm of breast: Secondary | ICD-10-CM

## 2013-09-01 DIAGNOSIS — Z23 Encounter for immunization: Secondary | ICD-10-CM

## 2013-09-01 DIAGNOSIS — M353 Polymyalgia rheumatica: Secondary | ICD-10-CM | POA: Diagnosis present

## 2013-09-01 DIAGNOSIS — R634 Abnormal weight loss: Secondary | ICD-10-CM | POA: Diagnosis present

## 2013-09-01 DIAGNOSIS — D649 Anemia, unspecified: Secondary | ICD-10-CM | POA: Diagnosis present

## 2013-09-01 DIAGNOSIS — E876 Hypokalemia: Secondary | ICD-10-CM | POA: Diagnosis present

## 2013-09-01 DIAGNOSIS — D509 Iron deficiency anemia, unspecified: Secondary | ICD-10-CM | POA: Diagnosis present

## 2013-09-01 DIAGNOSIS — E43 Unspecified severe protein-calorie malnutrition: Secondary | ICD-10-CM | POA: Diagnosis present

## 2013-09-01 DIAGNOSIS — J441 Chronic obstructive pulmonary disease with (acute) exacerbation: Principal | ICD-10-CM | POA: Diagnosis present

## 2013-09-01 LAB — PROTIME-INR
INR: 0.98 (ref 0.00–1.49)
Prothrombin Time: 12.8 seconds (ref 11.6–15.2)

## 2013-09-01 LAB — URINALYSIS, ROUTINE W REFLEX MICROSCOPIC
Bilirubin Urine: NEGATIVE
Ketones, ur: NEGATIVE mg/dL
Nitrite: NEGATIVE
Protein, ur: NEGATIVE mg/dL
Urobilinogen, UA: 0.2 mg/dL (ref 0.0–1.0)

## 2013-09-01 LAB — CBC
MCH: 30.7 pg (ref 26.0–34.0)
MCHC: 34.4 g/dL (ref 30.0–36.0)
Platelets: 336 10*3/uL (ref 150–400)
RBC: 3.84 MIL/uL — ABNORMAL LOW (ref 3.87–5.11)

## 2013-09-01 LAB — BASIC METABOLIC PANEL
Calcium: 9.8 mg/dL (ref 8.4–10.5)
GFR calc non Af Amer: 38 mL/min — ABNORMAL LOW (ref >90)
Potassium: 3 mEq/L — ABNORMAL LOW (ref 3.5–5.1)
Sodium: 142 mEq/L (ref 135–145)

## 2013-09-01 MED ORDER — POTASSIUM CHLORIDE 10 MEQ/100ML IV SOLN
10.0000 meq | Freq: Once | INTRAVENOUS | Status: AC
  Administered 2013-09-01: 10 meq via INTRAVENOUS
  Filled 2013-09-01: qty 100

## 2013-09-01 MED ORDER — ONDANSETRON HCL 4 MG/2ML IJ SOLN: 4.0000 mg | Freq: Three times a day (TID) | INTRAMUSCULAR | Status: DC | PRN

## 2013-09-01 MED ORDER — SODIUM CHLORIDE 0.9 % IV SOLN: INTRAVENOUS | Status: AC

## 2013-09-01 MED ORDER — LEVOFLOXACIN IN D5W 500 MG/100ML IV SOLN: 500.0000 mg | Freq: Once | INTRAVENOUS | Status: DC

## 2013-09-01 MED ORDER — SODIUM CHLORIDE 0.9 % IV BOLUS (SEPSIS)
500.0000 mL | Freq: Once | INTRAVENOUS | Status: AC
  Administered 2013-09-01: 500 mL via INTRAVENOUS

## 2013-09-01 MED ORDER — POTASSIUM CHLORIDE CRYS ER 20 MEQ PO TBCR
40.0000 meq | EXTENDED_RELEASE_TABLET | Freq: Once | ORAL | Status: AC
  Administered 2013-09-01: 40 meq via ORAL
  Filled 2013-09-01: qty 2

## 2013-09-01 NOTE — ED Notes (Signed)
pateint reports she "cannot breathe". Saturations 100% room air, pt lung sounds are clear, pt appears to be hyperventilating (tachypneic)  Repositioned, pt request to have O2. O2 applied via [place], respirations now WNL. Resting at this time

## 2013-09-01 NOTE — ED Notes (Signed)
Pt's family sts Pt has increased mucous x "a couple days" and increased weakness & loss of appetite x 1 day.  Sts Pt had been constipated and not eating.  Constipation resolved yesterday.  Pt sts chronic R side pain from arthritis.

## 2013-09-01 NOTE — ED Provider Notes (Signed)
CSN: [id number]     Arrival date & time 09/01/13  1648 History   First MD Initiated Contact with Patient 09/01/13 1726     Chief Complaint  Patient presents with  . Weakness  . Increased mucous   . Anorexia   (Consider location/radiation/quality/duration/timing/severity/associated sxs/prior Treatment) HPI Comments: Patient presents from home with a one-day history of decreased appetite, moist productive cough of yellow mucous, decreased activity level. She lives at home with her family. They deny any fever at home. She denies any chest pain, abdominal pain, headache. No nausea vomiting. No abdominal pain. Patient states she was constipated yesterday but improved after having a suppository.  The history is provided by a relative and the patient.    Past Medical History  Diagnosis Date  . Asthma   . Hypertension   . COPD (chronic obstructive pulmonary disease)   . Breast CA dx'd 1999    left   Past Surgical History  Procedure Laterality Date  . Vaginal hysterectomy     Family History  Problem Relation Age of Onset  . Diabetes Sister   . Diabetes Sister    History  Substance Use Topics  . Smoking status: Never Smoker   . Smokeless tobacco: Never Used  . Alcohol Use: No   OB History   Grav Para Term Preterm Abortions TAB SAB Ect Mult Living                 Review of Systems  Constitutional: Positive for activity change, appetite change and fatigue. Negative for fever.  HENT: Positive for rhinorrhea. Negative for neck pain.   Eyes: Negative for photophobia.  Respiratory: Positive for cough and shortness of breath. Negative for chest tightness.   Cardiovascular: Negative for chest pain.  Gastrointestinal: Positive for nausea. Negative for vomiting and abdominal pain.  Endocrine: Negative for polyuria.  Genitourinary: Negative for dysuria, hematuria, vaginal bleeding and vaginal discharge.  Musculoskeletal: Negative for back pain.  Skin: Negative for rash.   Neurological: Positive for weakness. Negative for dizziness.  A complete 10 system review of systems was obtained and all systems are negative except as noted in the HPI and PMH.    Allergies  Review of patient's allergies indicates no known allergies.  Home Medications   No current outpatient prescriptions on file. BP 161/79  Pulse 94  Temp(Src) 98.3 F (36.8 C) (Oral)  Resp 19  Ht 4\' 11"  (1.499 m)  Wt 78 lb 14.8 oz (35.8 kg)  BMI 15.93 kg/m2  SpO2 100% Physical Exam  Constitutional: She is oriented to person, place, and time. She appears well-developed and well-nourished. No distress.  HENT:  Head: Normocephalic and atraumatic.  Right Ear: External ear normal.  Mouth/Throat: Oropharynx is clear and moist. No oropharyngeal exudate.  Dry mucus membranes  Eyes: Conjunctivae and EOM are normal. Pupils are equal, round, and reactive to light.  Neck: Normal range of motion. Neck supple.  Cardiovascular: Normal rate and regular rhythm.   No murmur heard. Pulmonary/Chest: Effort normal and breath sounds normal. No respiratory distress.  Rhonchi at bases  Abdominal: Soft. There is no tenderness. There is no rebound and no guarding.  Musculoskeletal: Normal range of motion. She exhibits no edema and no tenderness.  Neurological: She is alert and oriented to person, place, and time. No cranial nerve deficit. She exhibits normal muscle tone. Coordination normal.  Skin: Skin is warm. No erythema.    ED Course  Procedures (including critical care time) Labs Review Labs Reviewed  CBC -  Abnormal; Notable for the following:    RBC 3.84 (*)    Hemoglobin 11.8 (*)    HCT 34.3 (*)    All other components within normal limits  BASIC METABOLIC PANEL - Abnormal; Notable for the following:    Potassium 3.0 (*)    CO2 35 (*)    Glucose, Bld 107 (*)    Creatinine, Ser 1.20 (*)    GFR calc non Af Amer 38 (*)    GFR calc Af Amer 44 (*)    All other components within normal limits   URINALYSIS, ROUTINE W REFLEX MICROSCOPIC - Abnormal; Notable for the following:    Leukocytes, UA SMALL (*)    All other components within normal limits  CULTURE, BLOOD (ROUTINE X 2)  CULTURE, BLOOD (ROUTINE X 2)  CULTURE, EXPECTORATED SPUTUM-ASSESSMENT  GRAM STAIN  PROTIME-INR  URINE MICROSCOPIC-ADD ON  BASIC METABOLIC PANEL  CBC  HIV ANTIBODY (ROUTINE TESTING)  LEGIONELLA ANTIGEN, URINE  STREP PNEUMONIAE URINARY ANTIGEN  CG4 I-STAT (LACTIC ACID)   Imaging Review Dg Chest 2 View  09/01/2013   CLINICAL DATA:  79 year old female shortness of Breath. Weakness. Chronic pulmonary disease.  EXAM: CHEST  2 VIEW  COMPARISON:  05/21/2013 and earlier.  FINDINGS: Semi upright AP and lateral views of the chest. Stable lung volumes, large but with chronic elevation of the left hemidiaphragm. Stable cardiac size and mediastinal contours. Visualized tracheal air column is within normal limits. No pneumothorax or pulmonary edema. No pleural effusion or acute pulmonary opacity. Osteopenia. No definite acute osseous abnormality.  IMPRESSION: Stable chronic lung disease. No superimposed acute findings are identified.   Electronically Signed   By: Augusto Gamble M.D.   On: 09/01/2013 18:57    MDM   1. Bronchitis   2. Anemia   3. Exacerbation of intermittent asthma    Patient presents with one-day history of increased mucus production, weakness, decreased appetite and shortness of breath. Vital stable, no distress. Bilateral rhonchi  Mild hypoxia and borderline temp.  CXR without infiltrate.  Will given levaquin for probable bronchitis. UA negative Labs at baseline. Patient too weak to stand or get out of bed.  Admission dw dr. Izola Price for IVF and antibiotics.   Date: 09/02/2013  Rate: 110  Rhythm: sinus tachycardia  QRS Axis: normal  Intervals: normal  ST/T Wave abnormalities: nonspecific ST/T changes  Conduction Disutrbances:none  Narrative Interpretation: significant artifact  Old EKG Reviewed:  unchanged    Glynn Octave, MD 09/02/13 1447

## 2013-09-02 ENCOUNTER — Encounter (HOSPITAL_COMMUNITY): Payer: Self-pay

## 2013-09-02 DIAGNOSIS — J45901 Unspecified asthma with (acute) exacerbation: Secondary | ICD-10-CM

## 2013-09-02 LAB — BASIC METABOLIC PANEL
BUN: 15 mg/dL (ref 6–23)
CO2: 32 mEq/L (ref 19–32)
Calcium: 9 mg/dL (ref 8.4–10.5)
Chloride: 102 mEq/L (ref 96–112)
Creatinine, Ser: 1.02 mg/dL (ref 0.50–1.10)
GFR calc Af Amer: 53 mL/min — ABNORMAL LOW (ref >90)
GFR calc non Af Amer: 46 mL/min — ABNORMAL LOW (ref >90)
Glucose, Bld: 93 mg/dL (ref 70–99)
Potassium: 3.6 mEq/L (ref 3.5–5.1)
Sodium: 142 mEq/L (ref 135–145)

## 2013-09-02 LAB — CBC
Hemoglobin: 11.1 g/dL — ABNORMAL LOW (ref 12.0–15.0)
MCH: 29.8 pg (ref 26.0–34.0)
MCHC: 32.9 g/dL (ref 30.0–36.0)
RDW: 14.4 % (ref 11.5–15.5)

## 2013-09-02 LAB — HIV ANTIBODY (ROUTINE TESTING W REFLEX): HIV: NONREACTIVE

## 2013-09-02 LAB — STREP PNEUMONIAE URINARY ANTIGEN: Strep Pneumo Urinary Antigen: NEGATIVE

## 2013-09-02 LAB — SEDIMENTATION RATE: Sed Rate: 32 mm/hr — ABNORMAL HIGH (ref 0–22)

## 2013-09-02 MED ORDER — MOMETASONE FURO-FORMOTEROL FUM 100-5 MCG/ACT IN AERO
2.0000 | INHALATION_SPRAY | Freq: Two times a day (BID) | RESPIRATORY_TRACT | Status: DC
  Administered 2013-09-02 – 2013-09-07 (×11): 2 via RESPIRATORY_TRACT
  Filled 2013-09-02: qty 8.8

## 2013-09-02 MED ORDER — ENSURE COMPLETE PO LIQD
237.0000 mL | Freq: Three times a day (TID) | ORAL | Status: DC
  Administered 2013-09-02 – 2013-09-07 (×11): 237 mL via ORAL

## 2013-09-02 MED ORDER — POTASSIUM CHLORIDE CRYS ER 20 MEQ PO TBCR
20.0000 meq | EXTENDED_RELEASE_TABLET | Freq: Two times a day (BID) | ORAL | Status: DC
  Administered 2013-09-02 – 2013-09-03 (×4): 20 meq via ORAL
  Filled 2013-09-02 (×8): qty 1

## 2013-09-02 MED ORDER — TRIAMTERENE-HCTZ 37.5-25 MG PO TABS
1.0000 | ORAL_TABLET | Freq: Every morning | ORAL | Status: DC
  Administered 2013-09-02 – 2013-09-05 (×4): 1 via ORAL
  Filled 2013-09-02 (×5): qty 1

## 2013-09-02 MED ORDER — OXYBUTYNIN CHLORIDE 5 MG PO TABS
2.5000 mg | ORAL_TABLET | Freq: Three times a day (TID) | ORAL | Status: DC
  Administered 2013-09-02 – 2013-09-07 (×17): 2.5 mg via ORAL
  Filled 2013-09-02 (×19): qty 0.5

## 2013-09-02 MED ORDER — POTASSIUM CHLORIDE CRYS ER 20 MEQ PO TBCR
20.0000 meq | EXTENDED_RELEASE_TABLET | Freq: Every day | ORAL | Status: DC
  Filled 2013-09-02: qty 1

## 2013-09-02 MED ORDER — AMLODIPINE BESYLATE 5 MG PO TABS
5.0000 mg | ORAL_TABLET | Freq: Every morning | ORAL | Status: DC
  Administered 2013-09-02 – 2013-09-07 (×6): 5 mg via ORAL
  Filled 2013-09-02 (×7): qty 1

## 2013-09-02 MED ORDER — ACETAMINOPHEN 325 MG PO TABS
325.0000 mg | ORAL_TABLET | ORAL | Status: DC | PRN
  Administered 2013-09-02 – 2013-09-04 (×5): 325 mg via ORAL
  Filled 2013-09-02 (×5): qty 1

## 2013-09-02 MED ORDER — TRAMADOL HCL 50 MG PO TABS
50.0000 mg | ORAL_TABLET | Freq: Four times a day (QID) | ORAL | Status: DC | PRN
  Administered 2013-09-03 – 2013-09-07 (×6): 50 mg via ORAL
  Filled 2013-09-02 (×6): qty 1

## 2013-09-02 MED ORDER — MAGNESIUM HYDROXIDE 400 MG/5ML PO SUSP: 30.0000 mL | Freq: Every day | ORAL | Status: DC | PRN

## 2013-09-02 MED ORDER — FERROUS SULFATE 325 (65 FE) MG PO TABS
325.0000 mg | ORAL_TABLET | Freq: Every morning | ORAL | Status: DC
  Administered 2013-09-02 – 2013-09-07 (×6): 325 mg via ORAL
  Filled 2013-09-02 (×7): qty 1

## 2013-09-02 MED ORDER — VITAMINS A & D EX OINT
TOPICAL_OINTMENT | CUTANEOUS | Status: AC
  Administered 2013-09-02: 1
  Filled 2013-09-02: qty 5

## 2013-09-02 MED ORDER — DEXTROSE 5 % IV SOLN
1.0000 g | Freq: Every day | INTRAVENOUS | Status: DC
  Administered 2013-09-02 – 2013-09-03 (×3): 1 g via INTRAVENOUS
  Filled 2013-09-02 (×4): qty 10

## 2013-09-02 MED ORDER — PANTOPRAZOLE SODIUM 40 MG PO TBEC
40.0000 mg | DELAYED_RELEASE_TABLET | Freq: Every day | ORAL | Status: DC
  Administered 2013-09-02 – 2013-09-07 (×6): 40 mg via ORAL
  Filled 2013-09-02 (×6): qty 1

## 2013-09-02 MED ORDER — ALBUTEROL SULFATE HFA 108 (90 BASE) MCG/ACT IN AERS
2.0000 | INHALATION_SPRAY | RESPIRATORY_TRACT | Status: DC | PRN
  Filled 2013-09-02: qty 6.7

## 2013-09-02 MED ORDER — ASPIRIN EC 81 MG PO TBEC
81.0000 mg | DELAYED_RELEASE_TABLET | Freq: Every morning | ORAL | Status: DC
  Administered 2013-09-02 – 2013-09-07 (×6): 81 mg via ORAL
  Filled 2013-09-02 (×7): qty 1

## 2013-09-02 MED ORDER — HYDROCODONE-ACETAMINOPHEN 5-325 MG PO TABS: 1.0000 | ORAL_TABLET | ORAL | Status: DC | PRN

## 2013-09-02 MED ORDER — PREDNISONE 5 MG PO TABS
5.0000 mg | ORAL_TABLET | ORAL | Status: DC
  Administered 2013-09-02: 5 mg via ORAL
  Filled 2013-09-02: qty 1

## 2013-09-02 MED ORDER — DEXTROSE-NACL 5-0.45 % IV SOLN
INTRAVENOUS | Status: DC
  Administered 2013-09-02: 1000 mL via INTRAVENOUS
  Administered 2013-09-02: 10:00:00 via INTRAVENOUS
  Administered 2013-09-03: 1000 mL via INTRAVENOUS

## 2013-09-02 MED ORDER — POTASSIUM CHLORIDE CRYS ER 20 MEQ PO TBCR
40.0000 meq | EXTENDED_RELEASE_TABLET | Freq: Once | ORAL | Status: AC
  Administered 2013-09-02: 40 meq via ORAL
  Filled 2013-09-02: qty 2

## 2013-09-02 MED ORDER — ENOXAPARIN SODIUM 40 MG/0.4ML ~~LOC~~ SOLN
40.0000 mg | SUBCUTANEOUS | Status: DC
  Administered 2013-09-02: 40 mg via SUBCUTANEOUS
  Filled 2013-09-02: qty 0.4

## 2013-09-02 MED ORDER — AZITHROMYCIN 500 MG PO TABS
500.0000 mg | ORAL_TABLET | Freq: Every day | ORAL | Status: DC
  Administered 2013-09-02 – 2013-09-06 (×6): 500 mg via ORAL
  Filled 2013-09-02 (×7): qty 1

## 2013-09-02 MED ORDER — ENOXAPARIN SODIUM 30 MG/0.3ML ~~LOC~~ SOLN
30.0000 mg | SUBCUTANEOUS | Status: DC
  Administered 2013-09-03 – 2013-09-07 (×5): 30 mg via SUBCUTANEOUS
  Filled 2013-09-02 (×5): qty 0.3

## 2013-09-02 NOTE — H&P (Signed)
[facility] History and Physical  Ebony Peterson NWG:[id number] DOB: [birth year] DOA: 09/01/2013  Referring physician: ED physician PCP: Lillia Mountain, MD   Chief Complaint: shortness of breath and cough  HPI:  Pt is 79 yo female who presented to [facility] ED with main concern of progressively worsening shortness of breath, cough productive of yellow sputum, started several days prior to this admission and associated with poor oral intake, malaise, subjective fevers, chills. Pt denies similar events in the past, denies chest pain, no specific abdominal or urinary concerns. [facility] asked to admit for presumptive PNA vs acute bronchitis.    Assessment and Plan:  Shortness of breath  - possibly related to acute bronchitis but given fevers, productive cough, will treat with ABX - sputum analysis ordered - urine legionella and strep pneumo ordered - pt started on IVF and will provide supportive care with IVF and analgesia as needed.  - PT and OT evaluation once pt able to tolerate  Acute renal failure - secondary to pre renal etiology - will provide IVF and repeat BMP in AM Hypokalemia - will supplement and repeat BMP in AM  Code Status: Full Family Communication: Pt at bedside Disposition Plan: Admit to medical floor   Review of Systems:  Constitutional: Positive for fever, chills and malaise/fatigue. Negative for diaphoresis.  HENT: Negative for hearing loss, ear pain, nosebleeds, congestion, sore throat, neck pain, tinnitus and ear discharge.   Eyes: Negative for blurred vision, double vision, photophobia, pain, discharge and redness.  Respiratory: Positive for cough, sputum production, shortness of breathr.   Cardiovascular: Negative for chest pain, palpitations, orthopnea, claudication and leg swelling.  Gastrointestinal: Negative for nausea, vomiting and abdominal pain. Negative for heartburn, constipation, blood in stool and melena.  Genitourinary: Negative for dysuria,  urgency, frequency, hematuria and flank pain.  Musculoskeletal: Negative for myalgias, back pain, joint pain and falls.  Skin: Negative for itching and rash.  Neurological: Negative for tingling, tremors, sensory change, speech change, focal weakness, loss of consciousness and headaches.  Endo/Heme/Allergies: Negative for environmental allergies and polydipsia. Does not bruise/bleed easily.  Psychiatric/Behavioral: Negative for suicidal ideas. The patient is not nervous/anxious.      Past Medical History  Diagnosis Date  . Asthma   . Hypertension   . COPD (chronic obstructive pulmonary disease)   . Breast CA dx'd 1999    left    Past Surgical History  Procedure Laterality Date  . Vaginal hysterectomy      Social History:  reports that she has never smoked. She has never used smokeless tobacco. She reports that she does not drink alcohol or use illicit drugs.  No Known Allergies  Family History  Problem Relation Age of Onset  . Diabetes Sister   . Diabetes Sister     Prior to Admission medications   Medication Sig Start Date End Date Taking? Authorizing Provider  albuterol (PROAIR HFA) 108 (90 BASE) MCG/ACT inhaler Inhale 2 puffs into the lungs every 4 (four) hours as needed for wheezing or shortness of breath.    Yes Historical Provider, MD  amLODipine (NORVASC) 5 MG tablet Take 5 mg by mouth every morning.    Yes Historical Provider, MD  aspirin EC 81 MG tablet Take 81 mg by mouth every morning.    Yes Historical Provider, MD  ferrous sulfate 325 (65 FE) MG tablet Take 325 mg by mouth every morning. 01/31/13  Yes Olivia Mackie, MD  Fluticasone-Salmeterol (ADVAIR) 100-50 MCG/DOSE AEPB Inhale 1 puff into the lungs  every 12 (twelve) hours.   Yes Historical Provider, MD  magnesium hydroxide (MILK OF MAGNESIA) 400 MG/5ML suspension Take 30 mLs by mouth daily as needed for constipation.   Yes Historical Provider, MD  omeprazole (PRILOSEC) 20 MG capsule Take 20 mg by mouth daily.    Yes Historical Provider, MD  oxybutynin (DITROPAN) 5 MG tablet Take 2.5 mg by mouth 3 (three) times daily.    Yes Historical Provider, MD  potassium chloride SA (K-DUR,KLOR-CON) 20 MEQ tablet Take 20 mEq by mouth daily.    Yes Historical Provider, MD  predniSONE (DELTASONE) 10 MG tablet Take 5 mg by mouth every other day.    Yes Historical Provider, MD  traMADol (ULTRAM) 50 MG tablet Take 50 mg by mouth every 6 (six) hours as needed for pain. 05/16/13  Yes Raeford Razor, MD  triamterene-hydrochlorothiazide (MAXZIDE-25) 37.5-25 MG per tablet Take 1 tablet by mouth every morning.   Yes Historical Provider, MD    Physical Exam: Filed Vitals:   09/01/13 2014 09/01/13 2224 09/02/13 0000 09/02/13 0035  BP: 144/68 150/49  161/79  Pulse: 92 90 85 94  Temp: 99.2 F (37.3 C)   98.3 F (36.8 C)  TempSrc:    Oral  Resp: 20 17 22 19   Height:    4\' 11"  (1.499 m)  Weight:    35.8 kg (78 lb 14.8 oz)  SpO2: 95% 100% 100% 100%    Physical Exam  Constitutional: Appears well-developed and well-nourished. No distress.  HENT: Normocephalic. External right and left ear normal. Oropharynx is clear and moist.  Eyes: Conjunctivae and EOM are normal. PERRLA, no scleral icterus.  Neck: Normal ROM. Neck supple. No JVD. No tracheal deviation. No thyromegaly.  CVS: RRR, S1/S2 +, no murmurs, no gallops, no carotid bruit.  Pulmonary: Effort and breath sounds normal, no stridor, slight rhonchi at bases but clears with cough  Abdominal: Soft. BS +,  no distension, tenderness, rebound or guarding.  Musculoskeletal: Normal range of motion. No edema and no tenderness.  Lymphadenopathy: No lymphadenopathy noted, cervical, inguinal. Neuro: Alert. Normal reflexes, muscle tone coordination. No cranial nerve deficit. Skin: Skin is warm and dry. No rash noted. Not diaphoretic. No erythema. No pallor.  Psychiatric: Normal mood and affect. Behavior, judgment, thought content normal.   Labs on Admission:  Basic Metabolic  Panel:  Recent Labs Lab 09/01/13 1752  NA 142  K 3.0*  CL 98  CO2 35*  GLUCOSE 107*  BUN 19  CREATININE 1.20*  CALCIUM 9.8   CBC:  Recent Labs Lab 09/01/13 1752  WBC 7.5  HGB 11.8*  HCT 34.3*  MCV 89.3  PLT 336   Radiological Exams on Admission: Dg Chest 2 View 09/01/2013   Stable chronic lung disease. No superimposed acute findings are identified.   Electronically Signed   By: Augusto Gamble M.D.   On: 09/01/2013 18:57    EKG: Normal sinus rhythm, no ST/T wave changes  Debbora Presto, MD  [facility] Pager [phone]  If 7PM-7AM, please contact night-coverage [email or web address] Password [facility] 09/02/2013, 12:41 AM

## 2013-09-02 NOTE — Progress Notes (Signed)
INITIAL NUTRITION ASSESSMENT  Pt meets criteria for severe MALNUTRITION in the context of chronic illness as evidenced by <75% estimated energy intake in the past several months in addition to severe muscle wasting and subcutaneous fat loss throughout body.  DOCUMENTATION CODES Per approved criteria  -Severe malnutrition in the context of chronic illness -Underweight   INTERVENTION: - Ensure Complete TID - Encouraged increased PO intake - Will continue to monitor  NUTRITION DIAGNOSIS: Inadequate oral intake related to chronic poor appetite as evidenced by 10% meal intake.   Goal: Pt to consume >90% of meals/supplements  Monitor:  Weights, labs, intake  Reason for Assessment: Nutrition risk, underweight  79 y.o. female  Admitting Dx: Shortness of breath, cough   ASSESSMENT: Pt discussed during multidisciplinary rounds.   Pt admitted with main concern of progressively worsening shortness of breath, cough productive of yellow sputum, started several days prior to this admission and associated with poor oral intake, malaise, subjective fevers, chills.  Pt weighs 78 pounds. Reports she used to weigh 120 pounds, unsure how long ago she weighed this much. C/o being just "skin and bones". Reports she's never been a big eater, just eats small meals and drinks 2-3 Ensure Plus/day.   Nutrition Focused Physical Exam:  Subcutaneous Fat:  Orbital Region: severe wasting Upper Arm Region: severe wasting Thoracic and Lumbar Region: severe wasting  Muscle:  Temple Region: severe wasting Clavicle Bone Region: severe wasting Clavicle and Acromion Bone Region: severe wasting Scapular Bone Region: severe wasting Dorsal Hand: severe wasting Patellar Region: severe wasting Anterior Thigh Region: severe wasting Posterior Calf Region: severe wasting  Edema: None noted    Height: Ht Readings from Last 1 Encounters:  09/02/13 4\' 11"  (1.499 m)    Weight: Wt Readings from Last 1  Encounters:  09/02/13 78 lb 14.8 oz (35.8 kg)    Ideal Body Weight: 98 lb   % Ideal Body Weight: 79%  Wt Readings from Last 10 Encounters:  09/02/13 78 lb 14.8 oz (35.8 kg)  05/24/12 102 lb 11.2 oz (46.584 kg)  01/26/12 110 lb (49.896 kg)  05/23/11 109 lb 12.8 oz (49.805 kg)  04/16/10 125 lb 2.1 oz (56.759 kg)  02/15/10 123 lb 2.1 oz (55.852 kg)  02/06/10 123 lb 8 oz (56.019 kg)  07/01/08 128 lb 8 oz (58.287 kg)    Usual Body Weight: 120 lb per pt report  % Usual Body Weight: 65%  BMI:  Body mass index is 15.93 kg/(m^2). Underweight  Estimated Nutritional Needs: Kcal: 1100-1300 Protein: 55-65g Fluid: 1.1-1.3L/day  Skin: Intact   Diet Order: General  EDUCATION NEEDS: -No education needs identified at this time   Intake/Output Summary (Last 24 hours) at 09/02/13 1155 Last data filed at 09/02/13 0847  Gross per 24 hour  Intake    720 ml  Output    600 ml  Net    120 ml    Last BM: 9/18   Labs:   Recent Labs Lab 09/01/13 1752 09/02/13 0230  NA 142 142  K 3.0* 3.6  CL 98 102  CO2 35* 32  BUN 19 15  CREATININE 1.20* 1.02  CALCIUM 9.8 9.0  GLUCOSE 107* 93    CBG (last 3)  No results found for this basename: GLUCAP,  in the last 72 hours  Scheduled Meds: . sodium chloride   Intravenous STAT  . amLODipine  5 mg Oral q morning - 10a  . aspirin EC  81 mg Oral q morning - 10a  .  azithromycin  500 mg Oral QHS  . cefTRIAXone (ROCEPHIN)  IV  1 g Intravenous QHS  . [START ON 09/03/2013] enoxaparin (LOVENOX) injection  30 mg Subcutaneous Q24H  . ferrous sulfate  325 mg Oral q morning - 10a  . mometasone-formoterol  2 puff Inhalation BID  . oxybutynin  2.5 mg Oral TID  . pantoprazole  40 mg Oral Daily  . potassium chloride SA  20 mEq Oral BID  . predniSONE  5 mg Oral QODAY  . triamterene-hydrochlorothiazide  1 tablet Oral q morning - 10a    Continuous Infusions: . dextrose 5 % and 0.45% NaCl 75 mL/hr at 09/02/13 1610    Past Medical History   Diagnosis Date  . Asthma   . Hypertension   . COPD (chronic obstructive pulmonary disease)   . Breast CA dx'd 1999    left    Past Surgical History  Procedure Laterality Date  . Vaginal hysterectomy      Levon Hedger MS, RD, LDN [phone] Pager [phone] After Hours Pager

## 2013-09-02 NOTE — Care Management Note (Signed)
   CARE MANAGEMENT NOTE 09/02/2013  Patient:  Ebony Peterson, Ebony Peterson   Account Number:  [id number]  Date Initiated:  09/02/2013  Documentation initiated by:  Short,Tonilynn Bieker  Subjective/Objective Assessment:   79 yo female admitted with bronchitis. Pt from home. PCP: Lillia Mountain, MD.     Action/Plan:   Home when stable   Anticipated DC Date:     Anticipated DC Plan:  HOME W HOME HEALTH SERVICES      DC Planning Services  CM consult      Choice offered to / List presented to:             Status of service:  In process, will continue to follow Medicare Important Message given?   (If response is "NO", the following Medicare IM given date fields will be blank) Date Medicare IM given:   Date Additional Medicare IM given:    Discharge Disposition:    Per UR Regulation:  Reviewed for med. necessity/level of care/duration of stay  If discussed at Long Length of Stay Meetings, dates discussed:    Comments:  09/02/13 1125 Veverly Larimer Tickner,RN,MSN [phone] Chart reviewed for utilization of services. CM consult to assess for [facility] needs. Will consult pt for potential barriers.

## 2013-09-02 NOTE — Care Management Note (Signed)
Cm spoke with pt's daughter Linton Ham concerning PT recommendation for HHPT vs SNF. Per pt's daughter pt resides with her three adult children who provide 24 hr assistance. Cm provided pt's daughter with choice list for [facility]. No choice made at this time. Pt's family requesting hospital bed and wheelchair.   Roxy Manns Kaelin,RN,MSN [phone]

## 2013-09-02 NOTE — Evaluation (Signed)
Physical Therapy Evaluation Patient Details Name: Ebony Peterson MRN: [id number] DOB: 1918/07/14 Today's Date: 09/02/2013 Time: 7829-5621 PT Time Calculation (min): 20 min  PT Assessment / Plan / Recommendation History of Present Illness  Pt is 79 yo female who presented to [facility] ED with main concern of progressively worsening shortness of breath, cough productive of yellow sputum, started several days prior to this admission and associated with poor oral intake, malaise, subjective fevers, chills. Pt denies similar events in the past, denies chest pain, no specific abdominal or urinary concerns. TRH asked to admit for presumptive PNA vs acute bronchitis.   Clinical Impression  Pt is very weak. No family present to discuss DC plan. Pt reports ambulatory recently. Pt will benefit from PT while in acute care to improve mobility.Pt will benefit from HHPT if DC's to home.    PT Assessment  Patient needs continued PT services    Follow Up Recommendations  Home health PT;SNF (no family to discuss DC plan.)    Does the patient have the potential to tolerate intense rehabilitation      Barriers to Discharge        Equipment Recommendations  Hospital bed;Wheelchair (measurements PT) (uncertain what equipment pt has.)    Recommendations for Other Services     Frequency Min 3X/week    Precautions / Restrictions Precautions Precautions: Fall Precaution Comments: pt is very frail   Pertinent Vitals/Pain Pt reports shoulder pain, and leg pain when moving.      Mobility  Bed Mobility Bed Mobility: Rolling Right;Right Sidelying to Sit;Sit to Supine Rolling Right: 1: +2 Total assist Rolling Right: Patient Percentage: 10% Right Sidelying to Sit: 1: +2 Total assist Right Sidelying to Sit: Patient Percentage: 0% Sit to Supine: 1: +2 Total assist Sit to Supine: Patient Percentage: 0% Details for Bed Mobility Assistance: pt is very painful for all mobility. Pt has much crepitus in all joints. Pt  is very weak. Transfers Transfers: Sit to Stand;Stand to Sit Sit to Stand: 1: +2 Total assist Sit to Stand: Patient Percentage: 40% Stand to Sit: 1: +2 Total assist Stand to Sit: Patient Percentage: 10% Ambulation/Gait Ambulation/Gait Assistance: Not tested (comment)    Exercises     PT Diagnosis: Generalized weakness;Acute pain  PT Problem List: Decreased strength;Decreased range of motion;Decreased activity tolerance;Decreased balance;Decreased mobility;Pain PT Treatment Interventions: Functional mobility training;Therapeutic activities;Therapeutic exercise;Patient/family education     PT Goals(Current goals can be found in the care plan section) Acute Rehab PT Goals Patient Stated Goal: I want to walk again PT Goal Formulation: Patient unable to participate in goal setting Time For Goal Achievement: 09/02/13 Potential to Achieve Goals: Fair  Visit Information  Last PT Received On: 09/02/13 Assistance Needed: +2 History of Present Illness: Pt is 79 yo female who presented to [facility] ED with main concern of progressively worsening shortness of breath, cough productive of yellow sputum, started several days prior to this admission and associated with poor oral intake, malaise, subjective fevers, chills. Pt denies similar events in the past, denies chest pain, no specific abdominal or urinary concerns. TRH asked to admit for presumptive PNA vs acute bronchitis.        Prior Functioning  Home Living Family/patient expects to be discharged to:: Private residence Living Arrangements: Children Available Help at Discharge: Family Type of Home: House Additional Comments: pt reports that she was able to walk to the bathroom a few days ago. no family present. Prior Function Level of Independence: Needs assistance Comments: no family present.  Cognition  Cognition Arousal/Alertness: Awake/alert Behavior During Therapy: WFL for tasks assessed/performed Overall Cognitive Status:  Difficult to assess    Extremity/Trunk Assessment Upper Extremity Assessment Upper Extremity Assessment: Defer to OT evaluation Lower Extremity Assessment Lower Extremity Assessment: RLE deficits/detail;LLE deficits/detail RLE Deficits / Details: decreased knee extension, pt did partially stand LLE Deficits / Details: similar to R    Balance Balance Balance Assessed: Yes Static Sitting Balance Static Sitting - Balance Support: Bilateral upper extremity supported;Feet supported Static Sitting - Level of Assistance: 1: +1 Total assist Static Sitting - Comment/# of Minutes: pt demonstrates no balance responses. Pt's UE's are so weak and painful, it is difficult to try to   End of Session PT - End of Session Activity Tolerance: Patient limited by fatigue;Patient limited by pain Patient left: in bed;with call bell/phone within reach Nurse Communication: Mobility status  GP     Rada Hay 09/02/2013, 12:31 PM  Blanchard Kelch PT [phone]

## 2013-09-03 DIAGNOSIS — R634 Abnormal weight loss: Secondary | ICD-10-CM | POA: Diagnosis present

## 2013-09-03 DIAGNOSIS — J209 Acute bronchitis, unspecified: Secondary | ICD-10-CM

## 2013-09-03 DIAGNOSIS — E43 Unspecified severe protein-calorie malnutrition: Secondary | ICD-10-CM | POA: Diagnosis present

## 2013-09-03 LAB — BASIC METABOLIC PANEL
BUN: 15 mg/dL (ref 6–23)
CO2: 26 mEq/L (ref 19–32)
Chloride: 104 mEq/L (ref 96–112)
Creatinine, Ser: 1.22 mg/dL — ABNORMAL HIGH (ref 0.50–1.10)
Potassium: 4.2 mEq/L (ref 3.5–5.1)

## 2013-09-03 LAB — CBC
HCT: 28.2 % — ABNORMAL LOW (ref 36.0–46.0)
Hemoglobin: 9.5 g/dL — ABNORMAL LOW (ref 12.0–15.0)
MCHC: 33.7 g/dL (ref 30.0–36.0)
MCV: 91.3 fL (ref 78.0–100.0)
RDW: 14.2 % (ref 11.5–15.5)
WBC: 9.2 10*3/uL (ref 4.0–10.5)

## 2013-09-03 LAB — LEGIONELLA ANTIGEN, URINE: Legionella Antigen, Urine: NEGATIVE

## 2013-09-03 LAB — EXPECTORATED SPUTUM ASSESSMENT W GRAM STAIN, RFLX TO RESP C

## 2013-09-03 LAB — OCCULT BLOOD X 1 CARD TO LAB, STOOL: Fecal Occult Bld: NEGATIVE

## 2013-09-03 MED ORDER — PREDNISONE 5 MG PO TABS
5.0000 mg | ORAL_TABLET | Freq: Three times a day (TID) | ORAL | Status: DC
  Administered 2013-09-03 – 2013-09-05 (×9): 5 mg via ORAL
  Filled 2013-09-03 (×13): qty 1

## 2013-09-03 NOTE — Progress Notes (Signed)
Physical Therapy Treatment Patient Details Name: Ebony Peterson MRN: [id number] DOB: Apr 14, 1918 Today's Date: 09/03/2013 Time: 1500-1510 PT Time Calculation (min): 10 min  PT Assessment / Plan / Recommendation  History of Present Illness pt c/o pain in right arm and leg today   PT Comments   Pt kept eyes closed during most of session today  Follow Up Recommendations  Home health PT (note that family wants to take pt home)     Does the patient have the potential to tolerate intense rehabilitation     Barriers to Discharge        Equipment Recommendations  Hospital bed;Wheelchair (measurements PT)    Recommendations for Other Services    Frequency Min 3X/week   Progress towards PT Goals Progress towards PT goals: Not progressing toward goals - comment (pt declined OOB activities this afternoon)  Plan Discharge plan needs to be updated    Precautions / Restrictions Precautions Precaution Comments: pt is very frail   Pertinent Vitals/Pain Pain in right arm and leg    Mobility  Bed Mobility Bed Mobility: Rolling Right Right Sidelying to Sit: Patient Percentage: 0% Details for Bed Mobility Assistance: pt with limited bed mobility although she directed multiple postitioning adjustments Transfers Transfers: Not assessed Details for Transfer Assistance: pt declined getting to EOB today Ambulation/Gait Ambulation/Gait Assistance: Not tested (comment)    Exercises General Exercises - Lower Extremity Ankle Circles/Pumps: AROM;Left;5 reps;Supine Hip Flexion/Marching: AROM;Left;5 reps;Supine (pt unable to perfrom with RLE due to pain)   PT Diagnosis:    PT Problem List:   PT Treatment Interventions:     PT Goals (current goals can now be found in the care plan section)    Visit Information  Last PT Received On: 09/03/13 Assistance Needed: +1 History of Present Illness: pt c/o pain in right arm and leg today    Subjective Data      Cognition   Cognition Arousal/Alertness: Awake/alert;Lethargic (pt kept eyes closed;says she is awake) Behavior During Therapy: WFL for tasks assessed/performed Overall Cognitive Status: Difficult to assess    Balance     End of Session PT - End of Session Activity Tolerance: Patient limited by fatigue;Patient limited by pain Patient left: in bed;with call bell/phone within reach;with nursing/sitter in room Nurse Communication: Mobility status   GP    Rosey Bath K. [place], [place] [phone] 09/03/2013, 3:59 PM

## 2013-09-03 NOTE — Progress Notes (Signed)
Subjective: Multiple complaints including pain R shoulder, arm , and leg.  Cough productive of clear secretions.  Objective: Vital signs in last 24 hours: Temp:  [98.4 F (36.9 C)-98.6 F (37 C)] 98.4 F (36.9 C) (09/19 0500) Pulse Rate:  [81-102] 81 (09/19 0500) Resp:  [16] 16 (09/19 0500) BP: (116-135)/(50-58) 135/58 mmHg (09/19 0500) SpO2:  [100 %] 100 % (09/19 0500) Weight change:  Last BM Date: 09/02/13  Intake/Output from previous day: 09/18 0701 - 09/19 0700 In: 1836.3 [P.O.:780; I.V.:1006.3; IV Piggyback:50] Out: 625 [Urine:625] Intake/Output this shift: Total I/O In: 962.5 [P.O.:240; I.V.:672.5; IV Piggyback:50] Out: 225 [Urine:225]  General appearance: alert and cooperative Resp: clear to auscultation bilaterally Cardio: regular rate and rhythm, S1, S2 normal, no murmur, click, rub or gallop GI: soft, non-tender; bowel sounds normal; no masses,  no organomegaly Extremities: extremities normal, atraumatic, no cyanosis or edema  Lab Results:  Recent Labs  09/02/13 0230 09/03/13 0455  WBC 7.5 9.2  HGB 11.1* 9.5*  HCT 33.7* 28.2*  PLT 293 266   BMET  Recent Labs  09/02/13 0230 09/03/13 0455  NA 142 138  K 3.6 4.2  CL 102 104  CO2 32 26  GLUCOSE 93 95  BUN 15 15  CREATININE 1.02 1.22*  CALCIUM 9.0 8.5    Studies/Results: Dg Chest 2 View  09/01/2013   CLINICAL DATA:  79 year old female shortness of Breath. Weakness. Chronic pulmonary disease.  EXAM: CHEST  2 VIEW  COMPARISON:  05/21/2013 and earlier.  FINDINGS: Semi upright AP and lateral views of the chest. Stable lung volumes, large but with chronic elevation of the left hemidiaphragm. Stable cardiac size and mediastinal contours. Visualized tracheal air column is within normal limits. No pneumothorax or pulmonary edema. No pleural effusion or acute pulmonary opacity. Osteopenia. No definite acute osseous abnormality.  IMPRESSION: Stable chronic lung disease. No superimposed acute findings are  identified.   Electronically Signed   By: Augusto Gamble M.D.   On: 09/01/2013 18:57    Medications: I have reviewed the patient's current medications.  Assessment/Plan: Principal Problem:   Acute bronchitis on IV rocephin, po azithromycin.  If stable switch to po azithro only tomorrow. Active Problems:   HYPERTENSION controlled   Anemia  hgb dropped with IVFs, h/o fe deficiency on iron. Follow, hemoccult stool.  Because of advanced age and frail condition, we have opted not to pursue a GI workup   Protein-calorie malnutrition, severe.  Ensure supplementation   Loss of weight  She has had significant loss of weight, anemia and some pain ln R side of body.  No headaches.  ?PMR.  Trial of prednisone 5mg  tid over weekend.  Check CRP Disposition  Home with Home health vs [facility]    LOS: 2 days   Wren Pryce JOSEPH 09/03/2013, 6:26 AM

## 2013-09-03 NOTE — Progress Notes (Signed)
OT Cancellation Note  Patient Details Name: Ebony Peterson MRN: [id number] DOB: 02/14/1918   Cancelled Treatment:    Reason Eval/Treat Not Completed: Fatigue/lethargy limiting ability to participate. Will recheck on pt for OT eval.  [name], Metro Kung 09/03/2013, 9:00 AM

## 2013-09-04 DIAGNOSIS — J209 Acute bronchitis, unspecified: Secondary | ICD-10-CM

## 2013-09-04 DIAGNOSIS — I10 Essential (primary) hypertension: Secondary | ICD-10-CM

## 2013-09-04 DIAGNOSIS — D649 Anemia, unspecified: Secondary | ICD-10-CM

## 2013-09-04 DIAGNOSIS — E43 Unspecified severe protein-calorie malnutrition: Secondary | ICD-10-CM

## 2013-09-04 LAB — BASIC METABOLIC PANEL
BUN: 10 mg/dL (ref 6–23)
CO2: 24 mEq/L (ref 19–32)
GFR calc non Af Amer: 48 mL/min — ABNORMAL LOW (ref >90)
Glucose, Bld: 107 mg/dL — ABNORMAL HIGH (ref 70–99)
Potassium: 5.8 mEq/L — ABNORMAL HIGH (ref 3.5–5.1)

## 2013-09-04 LAB — CBC
Hemoglobin: 8.9 g/dL — ABNORMAL LOW (ref 12.0–15.0)
MCH: 30.9 pg (ref 26.0–34.0)
MCHC: 34.2 g/dL (ref 30.0–36.0)
MCV: 90.3 fL (ref 78.0–100.0)
RBC: 2.88 MIL/uL — ABNORMAL LOW (ref 3.87–5.11)

## 2013-09-04 MED ORDER — SODIUM CHLORIDE 0.9 % IV SOLN
INTRAVENOUS | Status: DC
  Administered 2013-09-04: 1000 mL via INTRAVENOUS
  Administered 2013-09-05: 21:00:00 via INTRAVENOUS

## 2013-09-04 NOTE — Progress Notes (Signed)
Paged Dr.Norins with am labs of K+ 5.8 and Na+ 128. Orders received to hold/stop oral K dur and to change IVF to 0.9% NS at 50 cc/h.

## 2013-09-04 NOTE — Progress Notes (Signed)
OT Cancellation Note  Patient Details Name: Ebony Peterson MRN: [id number] DOB: [birth year]   Cancelled Treatment:    Reason Eval/Treat Not Completed: Fatigue/lethargy limiting ability to participate;Other (comment) (stated just had a bath and was worn out.  agreed to next day)  [name], Metro Kung 09/04/2013, 2:17 PM

## 2013-09-04 NOTE — Progress Notes (Signed)
Physical Therapy Treatment Patient Details Name: FLORITA NITSCH MRN: [id number] DOB: 02/08/1918 Today's Date: 09/04/2013 Time: 1478-2956 PT Time Calculation (min): 15 min  PT Assessment / Plan / Recommendation  History of Present Illness pt better today   PT Comments   Pt motivated to get to bedside commode for urination and then attempted to walk.  She had difficulty getting feet flat today to bear weight and had limited distance.  Follow Up Recommendations   HHPT     Does the patient have the potential to tolerate intense rehabilitation     Barriers to Discharge        Equipment Recommendations       Recommendations for Other Services    Frequency     Progress towards PT Goals Progress towards PT goals: Progressing toward goals  Plan Current plan remains appropriate    Precautions / Restrictions     Pertinent Vitals/Pain C/o diffuse pain    Mobility  Bed Mobility Bed Mobility: Supine to Sit Sit to Supine: 2: Max assist Details for Bed Mobility Assistance: pt assisting some with mobility at trunk. limited use at shoulders due to arthritis Transfers Transfers: Sit to Stand;Stand to Sit;Stand Pivot Transfers Sit to Stand: 1: +2 Total assist Sit to Stand: Patient Percentage: 40% Stand to Sit: 1: +2 Total assist Stand to Sit: Patient Percentage: 40% Stand Pivot Transfers: 2: Max assist Details for Transfer Assistance: stand pivto to [facility] to urinate Ambulation/Gait Ambulation/Gait Assistance: 1: +2 Total assist Ambulation/Gait: Patient Percentage: 50% Ambulation Distance (Feet): 4 Feet Assistive device: Rolling walker Ambulation/Gait Assistance Details: assist to hold body up and intiate weight shift Gait Pattern: Step-to pattern;Decreased step length - right;Decreased step length - left;Antalgic Gait velocity: slow General Gait Details: pt unable to get feet flat (plantar flexor tightness?) and had dfficulty advancing each leg. Pt distressed about her difficulty  walking Stairs: No Wheelchair Mobility Wheelchair Mobility: No    Exercises     PT Diagnosis:    PT Problem List:   PT Treatment Interventions:     PT Goals (current goals can now be found in the care plan section)    Visit Information  Last PT Received On: 09/04/13 History of Present Illness: pt better today    Subjective Data      Cognition  Cognition Arousal/Alertness: Awake/alert Behavior During Therapy: WFL for tasks assessed/performed Overall Cognitive Status: Within Functional Limits for tasks assessed    Balance     End of Session PT - End of Session Equipment Utilized During Treatment: Oxygen Activity Tolerance: Patient limited by fatigue;Patient limited by pain Patient left: in chair;with call bell/phone within reach Nurse Communication: Mobility status   GP    Rosey Bath K. [place], [place] [phone] 09/04/2013, 4:32 PM

## 2013-09-04 NOTE — Progress Notes (Signed)
Subjective: 79 y/o patient admitted with bronchitis. Also, multiple c/o myalgias and arthralgias - on trial of prednison for possible inflammatory muscle disease.   Objective: Lab:  Recent Labs  09/02/13 0230 09/03/13 0455 09/04/13 0425  WBC 7.5 9.2 7.7  HGB 11.1* 9.5* 8.9*  HCT 33.7* 28.2* 26.0*  MCV 90.3 91.3 90.3  PLT 293 266 251    Recent Labs  09/02/13 0230 09/03/13 0455 09/04/13 0425  NA 142 138 128*  K 3.6 4.2 5.8*  CL 102 104 97  GLUCOSE 93 95 107*  BUN 15 15 10   CREATININE 1.02 1.22* 0.98  CALCIUM 9.0 8.5 8.3*    Imaging: 9/17 CXR: IMPRESSION:  Stable chronic lung disease. No superimposed acute findings are  identified.  Scheduled Meds: . amLODipine  5 mg Oral q morning - 10a  . aspirin EC  81 mg Oral q morning - 10a  . azithromycin  500 mg Oral QHS  . cefTRIAXone (ROCEPHIN)  IV  1 g Intravenous QHS  . enoxaparin (LOVENOX) injection  30 mg Subcutaneous Q24H  . feeding supplement  237 mL Oral TID BM  . ferrous sulfate  325 mg Oral q morning - 10a  . mometasone-formoterol  2 puff Inhalation BID  . oxybutynin  2.5 mg Oral TID  . pantoprazole  40 mg Oral Daily  . predniSONE  5 mg Oral TID  . triamterene-hydrochlorothiazide  1 tablet Oral q morning - 10a   Continuous Infusions: . sodium chloride 1,000 mL (09/04/13 0644)   PRN Meds:.acetaminophen, albuterol, HYDROcodone-acetaminophen, magnesium hydroxide, traMADol   Physical Exam: Filed Vitals:   09/04/13 0500  BP: 128/55  Pulse: 88  Temp: 98.3 F (36.8 C)  Resp: 16   Total IO this adm: +4.7 liters gen'l - thin, frail appearing elderly AA woman in no distress Cor- RRR PUlm - normal breath sounds, no increased WOB Neuro - A&O x 3, speech clear.     Assessment/Plan: 1. Pulm/ID - admitted with bronchitis and seems to be doing well. Plan Change to azithromycin monotherapy  2. HTN- stable  3. Anemia - stable  4. Protein calorie malnutrition - she seems to have an OK appetite. Supplement  started  5. Rheum - she did not c/o myalgias during exam. Plan- continue low dose prednisone.    Illene Regulus [facility] (o) [phone]; (c) [phone] Call-grp - [facility]  Tele: [phone]  09/04/2013, 12:37 PM

## 2013-09-05 DIAGNOSIS — R634 Abnormal weight loss: Secondary | ICD-10-CM

## 2013-09-05 DIAGNOSIS — J40 Bronchitis, not specified as acute or chronic: Secondary | ICD-10-CM

## 2013-09-05 MED ORDER — VITAMINS A & D EX OINT
TOPICAL_OINTMENT | CUTANEOUS | Status: AC
  Administered 2013-09-05: 09:00:00
  Filled 2013-09-05: qty 5

## 2013-09-05 MED ORDER — FUROSEMIDE 20 MG PO TABS
20.0000 mg | ORAL_TABLET | Freq: Every day | ORAL | Status: DC
  Administered 2013-09-05: 20 mg via ORAL
  Filled 2013-09-05 (×2): qty 1

## 2013-09-05 NOTE — Progress Notes (Signed)
Occupational Therapy Evaluation Patient Details Name: Ebony Peterson MRN: [id number] DOB: 11/30/1918 Today's Date: 09/05/2013 Time: 1040-1055 OT Time Calculation (min): 15 min  OT Assessment / Plan / Recommendation History of present illness Pt is 79 yo female who presented to [facility] ED with main concern of progressively worsening shortness of breath, cough productive of yellow sputum, started several days prior to this admission and associated with poor oral intake, malaise, subjective fevers, chills. Pt denies similar events in the past, denies chest pain, no specific abdominal or urinary concerns. TRH asked to admit for presumptive PNA vs acute bronchitis.    Clinical Impression   Patient presents to OT dependent with ADLs. Will benefit from OT to maximize independence, provide caregiver education, and facilitate safe discharge.    OT Assessment  Patient needs continued OT Services    Follow Up Recommendations  Supervision/Assistance - 24 hour;Home health OT    Barriers to Discharge      Equipment Recommendations  3 in 1 bedside comode;Hospital bed    Recommendations for Other Services    Frequency  Min 2X/week    Precautions / Restrictions Precautions Precautions: Fall Precaution Comments: pt is very frail   Pertinent Vitals/Pain C/o pain R side (shoulder to foot) but not rated    ADL  Eating/Feeding: +1 Total assistance Where Assessed - Eating/Feeding: Bed level Grooming: +1 Total assistance Where Assessed - Grooming: Supine, head of bed up Upper Body Bathing: +1 Total assistance Where Assessed - Upper Body Bathing: Supine, head of bed up Lower Body Bathing: +1 Total assistance Where Assessed - Lower Body Bathing: Supine, head of bed up Upper Body Dressing: +1 Total assistance Where Assessed - Upper Body Dressing: Supine, head of bed up Lower Body Dressing: +1 Total assistance Where Assessed - Lower Body Dressing: Supine, head of bed up Transfers/Ambulation Related to  ADLs: Patient refused EOB/OOB on evaluation due to pain/fatigue. ADL Comments: Patient unable to use arms functionally to assist with ADLs at this time. She cannot fully bring either arm to her mouth/face. Dependent with ADLs at this time.    OT Diagnosis: Generalized weakness  OT Problem List: Decreased strength;Decreased range of motion;Decreased activity tolerance;Decreased safety awareness;Decreased knowledge of use of DME or AE;Pain;Impaired UE functional use;Cardiopulmonary status limiting activity;Decreased coordination OT Treatment Interventions: Self-care/ADL training;Therapeutic exercise;DME and/or AE instruction;Therapeutic activities;Patient/family education   OT Goals(Current goals can be found in the care plan section) Acute Rehab OT Goals Patient Stated Goal: to be able to go home OT Goal Formulation: With patient Time For Goal Achievement: 09/19/13 Potential to Achieve Goals: Fair  Visit Information  Last OT Received On: 09/05/13 History of Present Illness: Pt is 79 yo female who presented to [facility] ED with main concern of progressively worsening shortness of breath, cough productive of yellow sputum, started several days prior to this admission and associated with poor oral intake, malaise, subjective fevers, chills. Pt denies similar events in the past, denies chest pain, no specific abdominal or urinary concerns. TRH asked to admit for presumptive PNA vs acute bronchitis.        Prior Functioning     Home Living Family/patient expects to be discharged to:: Private residence Living Arrangements: Children Available Help at Discharge: Family Type of Home: House Home Equipment: Gilmer Mor - single point Additional Comments:   pt reports that she was able to walk to the bathroom a few days ago. no family present.  Prior Function Level of Independence: Needs assistance Communication Communication: No difficulties Dominant Hand: Right  Vision/Perception Vision -  History Patient Visual Report: No change from baseline   Cognition  Cognition Arousal/Alertness: Awake/alert Behavior During Therapy: WFL for tasks assessed/performed Overall Cognitive Status: Within Functional Limits for tasks assessed    Extremity/Trunk Assessment Upper Extremity Assessment Upper Extremity Assessment: RUE deficits/detail;LUE deficits/detail RUE Deficits / Details: Significant arthritis. PROM shoulder flexion 10 degrees. Elbow unable to extend fully. Wrist drop with trace wrist extension. Unable to bring hand to mouth/face. Grip WFL. RUE Coordination: decreased fine motor;decreased gross motor (Tremors noted RUE) LUE Deficits / Details: Significant arthritis. PROM shoulder flexion about 40 degrees. Elbow lacks full extension. Grip WFL, stronger than R. Unable to bring hand fully to mouth/face. Lower Extremity Assessment Lower Extremity Assessment: Defer to PT evaluation     End of Session OT - End of Session Activity Tolerance: Patient limited by fatigue;Patient limited by pain Patient left: in bed;with call bell/phone within reach  GO     Spiro Ausborn A 09/05/2013, 11:04 AM

## 2013-09-05 NOTE — Progress Notes (Signed)
Subjective: Ebony Peterson is very weak. She says she was ambulating at home. She continues to have trouble with her secretions - continuously spitting up saliva.   Objective: Lab:  Recent Labs  09/03/13 0455 09/04/13 0425  WBC 9.2 7.7  HGB 9.5* 8.9*  HCT 28.2* 26.0*  MCV 91.3 90.3  PLT 266 251    Recent Labs  09/03/13 0455 09/04/13 0425  NA 138 128*  K 4.2 5.8*  CL 104 97  GLUCOSE 95 107*  BUN 15 10  CREATININE 1.22* 0.98  CALCIUM 8.5 8.3*    Imaging:  Scheduled Meds: . amLODipine  5 mg Oral q morning - 10a  . aspirin EC  81 mg Oral q morning - 10a  . azithromycin  500 mg Oral QHS  . enoxaparin (LOVENOX) injection  30 mg Subcutaneous Q24H  . feeding supplement  237 mL Oral TID BM  . ferrous sulfate  325 mg Oral q morning - 10a  . mometasone-formoterol  2 puff Inhalation BID  . oxybutynin  2.5 mg Oral TID  . pantoprazole  40 mg Oral Daily  . predniSONE  5 mg Oral TID  . triamterene-hydrochlorothiazide  1 tablet Oral q morning - 10a   Continuous Infusions: . sodium chloride 1,000 mL (09/04/13 0644)   PRN Meds:.acetaminophen, albuterol, HYDROcodone-acetaminophen, magnesium hydroxide, traMADol   Physical Exam: Filed Vitals:   09/05/13 1358  BP: 146/81  Pulse: 89  Temp: 97.8 F (36.6 C)  Resp: 17   gen'l - very elderly woman appears fragile and weak HEENT- missing many teeth. C&S muddy, arcus senilis, irregular pupils Cor- RRR Pulm - no increased work of breathing, coarse rhonchi. Abd- soft MSK - deformed shoulders Neuro - awake, soft-weak voice, oriented to person and place      Assessment/Plan: 1. Pulm/ID - day 2 oral azithromycin as monotherapy, day # 5/7 total. No respiratory distress.  2. HTN -   BP Readings from Last 3 Encounters:  09/05/13 146/81  08/23/13 150/76  08/21/13 120/64   Plan - d/c maxide  Start furosemide 20 mg once a day.  3. Anemia - stable  4. Protein-calorie malnutrition - taking Ensure  5. Rheum - no c/o muscle  pain. Day #3 tid prednisone.  Plan  F/u sed rate  6. Dispo - PT/OT recommend HHPT/OT and 24/7 supervision   Illene Regulus [place] [facility] (o) [phone]; (c) [phone] Call-grp - [facility]  Tele: [phone]  09/05/2013, 2:33 PM

## 2013-09-06 ENCOUNTER — Inpatient Hospital Stay (HOSPITAL_COMMUNITY): Payer: Medicare Other

## 2013-09-06 LAB — BASIC METABOLIC PANEL
BUN: 26 mg/dL — ABNORMAL HIGH (ref 6–23)
CO2: 27 mEq/L (ref 19–32)
Chloride: 101 mEq/L (ref 96–112)
Glucose, Bld: 116 mg/dL — ABNORMAL HIGH (ref 70–99)
Potassium: 4.4 mEq/L (ref 3.5–5.1)
Sodium: 135 mEq/L (ref 135–145)

## 2013-09-06 MED ORDER — PREDNISONE 5 MG PO TABS
5.0000 mg | ORAL_TABLET | Freq: Two times a day (BID) | ORAL | Status: DC
  Administered 2013-09-06 – 2013-09-07 (×4): 5 mg via ORAL
  Filled 2013-09-06 (×5): qty 1

## 2013-09-06 MED ORDER — INFLUENZA VAC SPLIT QUAD 0.5 ML IM SUSP
0.5000 mL | INTRAMUSCULAR | Status: AC
  Administered 2013-09-07: 0.5 mL via INTRAMUSCULAR
  Filled 2013-09-06 (×2): qty 0.5

## 2013-09-06 NOTE — Progress Notes (Signed)
Pt.'s IV due to be restarted.  Pt. Refused IV restart. Pt. Educated on importance of rotating IV sites, will continue to monitor.

## 2013-09-06 NOTE — Progress Notes (Signed)
[facility] is providing the following services: Hospital bed  If patient discharges after hours, please call [phone].   Ebony Peterson 09/06/2013, 9:50 AM

## 2013-09-06 NOTE — Progress Notes (Signed)
Physical Therapy Treatment Patient Details Name: NATALYAH CUMMISKEY MRN: [id number] DOB: [birth year] Today's Date: 09/06/2013 Time: 1043-1100 PT Time Calculation (min): 17 min  PT Assessment / Plan / Recommendation  History of Present Illness pt more talkative this am   PT Comments   Some improvement with gait today, but pt will still need +2 assist with family at home  Follow Up Recommendations   HHPT     Does the patient have the potential to tolerate intense rehabilitation     Barriers to Discharge        Equipment Recommendations       Recommendations for Other Services    Frequency     Progress towards PT Goals Progress towards PT goals: Progressing toward goals  Plan Current plan remains appropriate    Precautions / Restrictions Precautions Precautions: Fall Precaution Comments: pt is very frail Restrictions Weight Bearing Restrictions: No   Pertinent Vitals/Pain Pt c/o pain in right shoulder    Mobility  Bed Mobility Bed Mobility: Supine to Sit Sit to Supine: 2: Max assist Details for Bed Mobility Assistance: pt assisting some with mobility at trunk. limited use at shoulders due to arthritis Transfers Transfers: Sit to Stand;Stand to Sit;Stand Pivot Transfers Sit to Stand: 1: +2 Total assist Sit to Stand: Patient Percentage: 50% Stand to Sit: 1: +2 Total assist Stand to Sit: Patient Percentage: 50% Stand Pivot Transfers: 2: Max assist Ambulation/Gait Ambulation/Gait Assistance: 1: +2 Total assist Ambulation/Gait: Patient Percentage: 70% Ambulation Distance (Feet): 10 Feet Assistive device: Rolling walker Ambulation/Gait Assistance Details: some assist for weight shift and encouragement to keep feet flat Gait Pattern: Step-to pattern;Decreased step length - right;Decreased step length - left;Antalgic Gait velocity: slow General Gait Details: Pt with difficulty with weight shift and stepping due to ??  pt occasionally stayed in plantarflexion but was easily able  to get foot flat today. Stairs: No Wheelchair Mobility Wheelchair Mobility: No    Exercises     PT Diagnosis:    PT Problem List:   PT Treatment Interventions:     PT Goals (current goals can now be found in the care plan section)    Visit Information  Last PT Received On: 09/06/13 Assistance Needed: +2 History of Present Illness: pt more talkative this am    Subjective Data      Cognition  Cognition Arousal/Alertness: Awake/alert Behavior During Therapy: WFL for tasks assessed/performed Overall Cognitive Status: Within Functional Limits for tasks assessed    Balance  Static Sitting Balance Static Sitting - Balance Support: Feet unsupported Static Sitting - Level of Assistance: 5: Stand by assistance Static Sitting - Comment/# of Minutes: Pt able to sit on edge of bed today without difficulty, but needed encouragement to bend knees to get feet on floor  End of Session PT - End of Session Activity Tolerance: Patient limited by pain;Patient limited by fatigue Patient left: in chair;with call bell/phone within reach   GP   [facility] K. [place], [place] [phone] 09/06/2013, 11:29 AM

## 2013-09-06 NOTE — Progress Notes (Signed)
Subjective: R side pain a little better, has difficulty lifting R arm to mouth  Objective: Vital signs in last 24 hours: Temp:  [97.8 F (36.6 C)-98.8 F (37.1 C)] 98.8 F (37.1 C) (09/22 0505) Pulse Rate:  [80-89] 80 (09/22 0505) Resp:  [16-17] 16 (09/22 0505) BP: (146-159)/(61-81) 159/65 mmHg (09/22 0505) SpO2:  [98 %-99 %] 98 % (09/22 0505) Weight change:  Last BM Date: 09/05/13  Intake/Output from previous day: 09/21 0701 - 09/22 0700 In: 360 [P.O.:360] Out: 1100 [Urine:1100] Intake/Output this shift: Total I/O In: 120 [P.O.:120] Out: 150 [Urine:150]  General appearance: alert and cooperative Resp: clear to auscultation bilaterally Cardio: regular rate and rhythm, S1, S2 normal, no murmur, click, rub or gallop Neurologic: Grossly normal  Lab Results:  Recent Labs  09/04/13 0425  WBC 7.7  HGB 8.9*  HCT 26.0*  PLT 251   BMET  Recent Labs  09/04/13 0425 09/06/13 0353  NA 128* 135  K 5.8* 4.4  CL 97 101  CO2 24 27  GLUCOSE 107* 116*  BUN 10 26*  CREATININE 0.98 1.20*  CALCIUM 8.3* 8.8    Studies/Results: No results found.  Medications: I have reviewed the patient's current medications.  Assessment/Plan: Principal Problem:  Acute bronchitis on  po azithromycin.  Active Problems:  HYPERTENSION controlled, bun and cr up, hold diuretic, increase ivfs Anemia hgb dropped with IVFs, h/o fe deficiency on iron.  hemoccult neg. Because of advanced age and frail condition, we have opted not to pursue a GI workup  Protein-calorie malnutrition, severe. Ensure supplementation  Loss of weight .ESR and CRP normal, possibly feels a little better on prednisone.decrease dose. R sided weakness and pain.  Will check CT brain Disposition Home with Home health tomorrow    LOS: 5 days   [facility] 09/06/2013, 6:41 AM

## 2013-09-06 NOTE — Care Management Note (Signed)
Cm spoke with patient's daughter Linton Ham on the telephone concerning dc planning. Pt recommendations for HHPT. Per family choice [facility] to provide [facility] services. [facility] rep Lanae Crumbly notified of referral. Pt request DME. DM orders entered. [facility] DME liaison [place] notified. No other needs indentified at this time.    Roxy Manns [place],RN,MSN [phone]

## 2013-09-07 LAB — CK TOTAL AND CKMB (NOT AT ARMC)
CK, MB: 2.3 ng/mL (ref 0.3–4.0)
Relative Index: INVALID (ref 0.0–2.5)

## 2013-09-07 LAB — BASIC METABOLIC PANEL
BUN: 28 mg/dL — ABNORMAL HIGH (ref 6–23)
CO2: 28 mEq/L (ref 19–32)
Chloride: 103 mEq/L (ref 96–112)
Creatinine, Ser: 1.16 mg/dL — ABNORMAL HIGH (ref 0.50–1.10)
GFR calc Af Amer: 46 mL/min — ABNORMAL LOW (ref >90)
Glucose, Bld: 118 mg/dL — ABNORMAL HIGH (ref 70–99)
Potassium: 3.4 mEq/L — ABNORMAL LOW (ref 3.5–5.1)

## 2013-09-07 LAB — CBC
HCT: 25.7 % — ABNORMAL LOW (ref 36.0–46.0)
Hemoglobin: 9.1 g/dL — ABNORMAL LOW (ref 12.0–15.0)
MCHC: 35.4 g/dL (ref 30.0–36.0)
MCV: 88.9 fL (ref 78.0–100.0)
RDW: 13.9 % (ref 11.5–15.5)

## 2013-09-07 MED ORDER — PREDNISONE 5 MG PO TABS: 5.0000 mg | ORAL_TABLET | Freq: Two times a day (BID) | ORAL | Status: DC

## 2013-09-07 MED ORDER — AEROCHAMBER PLUS FLO-VU MEDIUM MISC
1.0000 | Freq: Once | Status: AC
  Administered 2013-09-07: 1
  Filled 2013-09-07: qty 1

## 2013-09-07 MED ORDER — ALUM & MAG HYDROXIDE-SIMETH 200-200-20 MG/5ML PO SUSP: 30.0000 mL | ORAL | Status: DC | PRN

## 2013-09-07 MED ORDER — POTASSIUM CHLORIDE CRYS ER 20 MEQ PO TBCR
40.0000 meq | EXTENDED_RELEASE_TABLET | Freq: Once | ORAL | Status: AC
  Administered 2013-09-07: 40 meq via ORAL
  Filled 2013-09-07: qty 2

## 2013-09-07 NOTE — Progress Notes (Signed)
Occupational Therapy Treatment Patient Details Name: Ebony Peterson MRN: [id number] DOB: Jul 31, 1918 Today's Date: 09/07/2013 Time: 9629-5284 OT Time Calculation (min): 33 min  OT Assessment / Plan / Recommendation  History of present illness Pt is 79 yo female who presented to [facility] ED with main concern of progressively worsening shortness of breath, cough productive of yellow sputum, started several days prior to this admission and associated with poor oral intake, malaise, subjective fevers, chills.    OT comments  Pt significantly weak and will need +2 assist for ADL tasks at home. No family present for caregiver education this session. Pt able to assist some with UEs today for repositioning but still very limited in UEs due to arthritis and weakness.     Follow Up Recommendations  Home health OT;Supervision/Assistance - 24 hour (only if 24/7 and +2 assist available.)    Barriers to Discharge       Equipment Recommendations  3 in 1 bedside comode    Recommendations for Other Services    Frequency Min 2X/week   Progress towards OT Goals Progress towards OT goals: Progressing toward goals  Plan Discharge plan remains appropriate    Precautions / Restrictions Precautions Precautions: Fall Precaution Comments: pt is very frail Restrictions Weight Bearing Restrictions: No   Pertinent Vitals/Pain Some discomfort in R shoulder with weightbearing.; reposition and rest.   ADL  ADL Comments: No family present today for caregiver education. Pt declined need to get up to [facility] Worked on functional transitions in prep for [facility] transfers including supine to sit and side scooting for repositioning in bed. Pt able to more functionally use UEs in activity today including assisting with pad placement and pushing on bed to side scoot toward [facility] with assist. Discussed use of bedpan on days when she feel weak/painful and BSC next to bed when able to get up.     OT Diagnosis:    OT Problem List:   OT  Treatment Interventions:     OT Goals(current goals can now be found in the care plan section) Acute Rehab OT Goals Patient Stated Goal: to be able to go home  Visit Information  Last OT Received On: 09/07/13 Assistance Needed: +2 History of Present Illness: Pt is 79 yo female who presented to [facility] ED with main concern of progressively worsening shortness of breath, cough productive of yellow sputum, started several days prior to this admission and associated with poor oral intake, malaise, subjective fevers, chills.     Subjective Data      Prior Functioning       Cognition  Cognition Arousal/Alertness: Awake/alert Behavior During Therapy: WFL for tasks assessed/performed Overall Cognitive Status: Within Functional Limits for tasks assessed    Mobility  Bed Mobility Bed Mobility: Supine to Sit;Sitting - Scoot to Edge of Bed;Sit to Supine Supine to Sit: HOB elevated;1: +1 Total assist Sitting - Scoot to Edge of Bed: 3: Mod assist Sit to Supine: 2: Max assist Details for Bed Mobility Assistance: Pt able to use UEs on bed to help scoot to EOB. Pt needed total assist for supine to sit as unable to move LEs over to EOB or bring trunk to upright. Pt able to help lower trunk to bed with assist but needed assist for LEs onto bed.     Exercises      Balance Balance Balance Assessed: Yes Static Sitting Balance Static Sitting - Balance Support: Feet unsupported Static Sitting - Level of Assistance: 5: Stand by assistance   End  of Session OT - End of Session Activity Tolerance: Patient limited by fatigue;Patient limited by pain Patient left: in bed;with call bell/phone within reach;with bed alarm set  GO     Lennox Laity [phone] 09/07/2013, 12:53 PM

## 2013-09-07 NOTE — Progress Notes (Signed)
Pt. Was discharged home. She was given her discharge instructions, prescriptions, and all questions were answered.  Pt. Was transported home by her daughter.

## 2013-09-07 NOTE — Progress Notes (Signed)
Called to consult on patient who is complaining of chest tightness. V/S- P-94 SR  BP- 146/67  Resp. 27 Sat 97%. Pt states it is a squeeze. Pt had an episode and clearly was having discomfort--grimacing. She had received tramadol earlier. EKG was done and it was normal, MD notified. Mylanta ordered and labs. Pt with stable v/s and tightness intermmitent. Resting comfortably when RRT RN left.

## 2013-09-07 NOTE — Progress Notes (Signed)
Received referral from length of stay meeting for [facility] Care Management services. Spoke with patient at bedside about [facility] Care Management. She asked that this writer contact her daughter, Linton Ham at [phone]. Daughter agreeable to [facility] Care Management. Discussed how these services will not interfere with home health. Patient will receive post hospital discharge call and will be evaluated for monthly home visits. Consents signed and packet left at bedside.  Raiford Noble, MSN- Ed, Charity fundraiser, BSN-- [facility] [phone]

## 2013-09-07 NOTE — Progress Notes (Signed)
Subjective: Some squeezing in chest early this am  Objective: Vital signs in last 24 hours: Temp:  [98.1 F (36.7 C)-98.7 F (37.1 C)] 98.1 F (36.7 C) (09/23 0528) Pulse Rate:  [80-85] 81 (09/23 0528) Resp:  [14-16] 16 (09/23 0528) BP: (130-137)/(43-69) 137/69 mmHg (09/23 0528) SpO2:  [96 %-99 %] 99 % (09/23 0528) Weight change:  Last BM Date: 09/05/13  Intake/Output from previous day: 09/22 0701 - 09/23 0700 In: 360 [P.O.:360] Out: 300 [Urine:300] Intake/Output this shift: Total I/O In: -  Out: 300 [Urine:300]  General appearance: alert and cooperative Resp: clear to auscultation bilaterally Cardio: regular rate and rhythm, S1, S2 normal, no murmur, click, rub or gallop  Lab Results:  Recent Labs  09/07/13 0333  WBC 7.6  HGB 9.1*  HCT 25.7*  PLT 260   BMET  Recent Labs  09/06/13 0353 09/07/13 0333  NA 135 137  K 4.4 3.4*  CL 101 103  CO2 27 28  GLUCOSE 116* 118*  BUN 26* 28*  CREATININE 1.20* 1.16*  CALCIUM 8.8 8.7    Studies/Results: Ct Head Wo Contrast  09/06/2013   CLINICAL DATA:  Right-sided weakness and pain. History of breast cancer and high blood pressure.  EXAM: CT HEAD WITHOUT CONTRAST  TECHNIQUE: Contiguous axial images were obtained from the base of the skull through the vertex without intravenous contrast.  COMPARISON:  None.  FINDINGS: No intracranial hemorrhage.  Small vessel disease type changes without CT evidence of large acute infarct.  No intracranial mass lesion noted on this unenhanced exam.  Global atrophy without hydrocephalus.  Vascular calcifications.  No bony destructive lesion.  IMPRESSION: No intracranial hemorrhage or CT evidence of large acute infarct.  Prominent small vessel disease type changes.  Global atrophy.   Electronically Signed   By: Bridgett Larsson   On: 09/06/2013 09:34    Medications: I have reviewed the patient's current medications.  Assessment/Plan: Ready for discharge.  Had some squeezing in chest this am.   EKG was nonacute.  Troponin I and CPK mb normal. Will recheck cardiac enzymes later today and if normal, discharge to home  LOS: 6 days   [facility] 09/07/2013, 6:25 AM

## 2013-09-07 NOTE — Progress Notes (Signed)
Patient called me to room complaining of tightness in left side of her chest. Vitals signs were 145/64, HR 95, O2 sat 96 on RA, and R 22. Patient states the pain comes and goes, and she is very distressed during the pain. Rapid Response nurse called to room to evaluate patient. EKG performed which was within normal limits. MD on call paged and orders received for cardiac enzymes and mylanta. Patient is aware of plan, and appears calmer now. Will inform MD in the am, and the oncoming nurse.

## 2013-09-08 LAB — CULTURE, BLOOD (ROUTINE X 2): Culture: NO GROWTH

## 2013-09-09 NOTE — Discharge Summary (Signed)
Physician Discharge Summary  Patient ID: Ebony Peterson MRN: [id number] DOB/AGE: [birth year] 79 y.o.  Admit date: 09/01/2013 Discharge date: 09/09/2013  Admission Diagnoses: Acute bronchitis Myalgia Hypertension Asthma Anemia Protein calorie malnutrition, severe  Discharge Diagnoses:  Principal Problem:   Acute bronchitis Active Problems: Myalgia   HYPERTENSION  Asthma   Anemia   Protein-calorie malnutrition, severe   Loss of weight   Discharged Condition: good  Hospital Course: The patient was admitted on September 18 complaining of worsening shortness of breath cough productive yellow sputum several days prior to admission. She had chronic poor appetite, weight loss, malaise and was complaining of pain especially in her right arm and leg. At admission potassium is 3.0, white count normal, chest x-ray showed no acute disease, stable chronic lung disease. The patient was admitted and placed on by mouth azithromycin. Her cough gradually got bette.. She did have some mild azotemia was given IV fluids. Her diuretic was stopped. She was continued on amlodipine for hypertension with reasonable control. Her hemoglobin did drop on rehydration to 9.5, this was followed and stabilized. She complained of pain in the right side of her body. Sedimentation rate was mildly elevated at 32 and she was started on prednisone 5 mg 3 times a day and after 3 days did have some improvement in her pain.. She did also complain of some weakness in the right side of her body, a CT scan of the brain showed small vessel type changes, global atrophy but no evidence of infarct, tumor or hemorrhage. The patient was seen by physical therapy and home physical therapy was arranged with home hospital bed and 3 in 1 commode. She did have some chest wheezing on the day of discharge. EKG was unremarkable. Troponin was normal x2. Her chest wheezing was felt to be noncardiac.  Consults: None  Significant Diagnostic Studies:  labs: Legionella urinary antigen negative, strep pneumoniae urinary antigen negative, at discharge sodium 134, potassium 3.4, BUN 28 creatinine 1.16, WBC 7.6, hemoglobin 9.1, MCV 80.9 and radiology: CXR: Stable chronic lung disease and CT scan: As above  Treatments: IV hydration, antibiotics: azithromycin, steroids: prednisone and therapies: PT  Discharge Exam: Blood pressure 137/69, pulse 81, temperature 98.1 F (36.7 C), temperature source Oral, resp. rate 16, height 4\' 11"  (1.499 m), weight 35.8 kg (78 lb 14.8 oz), SpO2 97.00%. General appearance: alert and cooperative Resp: clear to auscultation bilaterally Cardio: regular rate and rhythm, S1, S2 normal, no murmur, click, rub or gallop  Disposition: 06-Home-Health Care Svc     Medication List    STOP taking these medications       potassium chloride SA 20 MEQ tablet  Commonly known as:  K-DUR,KLOR-CON     triamterene-hydrochlorothiazide 37.5-25 MG per tablet  Commonly known as:  MAXZIDE-25      TAKE these medications       amLODipine 5 MG tablet  Commonly known as:  NORVASC  Take 5 mg by mouth every morning.     aspirin EC 81 MG tablet  Take 81 mg by mouth every morning.     ferrous sulfate 325 (65 FE) MG tablet  Take 325 mg by mouth every morning.     Fluticasone-Salmeterol 100-50 MCG/DOSE Aepb  Commonly known as:  ADVAIR  Inhale 1 puff into the lungs every 12 (twelve) hours.     magnesium hydroxide 400 MG/5ML suspension  Commonly known as:  MILK OF MAGNESIA  Take 30 mLs by mouth daily as needed for constipation.  omeprazole 20 MG capsule  Commonly known as:  PRILOSEC  Take 20 mg by mouth daily.     oxybutynin 5 MG tablet  Commonly known as:  DITROPAN  Take 2.5 mg by mouth 3 (three) times daily.     predniSONE 5 MG tablet  Commonly known as:  DELTASONE  Take 1 tablet (5 mg total) by mouth 2 (two) times daily with a meal.     PROAIR HFA 108 (90 BASE) MCG/ACT inhaler  Generic drug:  albuterol  Inhale  2 puffs into the lungs every 4 (four) hours as needed for wheezing or shortness of breath.     traMADol 50 MG tablet  Commonly known as:  ULTRAM  Take 50 mg by mouth every 6 (six) hours as needed for pain.           Follow-up Information   Follow up In 7 days.      Follow up with Lillia Mountain, MD In 1 week.   Specialty:  Internal Medicine   Contact information:   [address], [address] Jaynie Crumble [place] [address] [phone]       Signed: Lillia Mountain 09/09/2013, 6:37 AM

## 2013-12-31 ENCOUNTER — Encounter (HOSPITAL_COMMUNITY): Payer: Self-pay | Admitting: Emergency Medicine

## 2013-12-31 ENCOUNTER — Emergency Department (HOSPITAL_COMMUNITY)
Admission: EM | Admit: 2013-12-31 | Discharge: 2013-12-31 | Disposition: A | Discharge status: Home / Self Care | Payer: Medicare Other | Attending: Emergency Medicine | Admitting: Emergency Medicine

## 2013-12-31 ENCOUNTER — Emergency Department (HOSPITAL_COMMUNITY): Payer: Medicare Other

## 2013-12-31 DIAGNOSIS — Z853 Personal history of malignant neoplasm of breast: Secondary | ICD-10-CM | POA: Insufficient documentation

## 2013-12-31 DIAGNOSIS — J4489 Other specified chronic obstructive pulmonary disease: Secondary | ICD-10-CM | POA: Insufficient documentation

## 2013-12-31 DIAGNOSIS — Z7982 Long term (current) use of aspirin: Secondary | ICD-10-CM | POA: Insufficient documentation

## 2013-12-31 DIAGNOSIS — Z79899 Other long term (current) drug therapy: Secondary | ICD-10-CM | POA: Insufficient documentation

## 2013-12-31 DIAGNOSIS — W1809XA Striking against other object with subsequent fall, initial encounter: Secondary | ICD-10-CM | POA: Insufficient documentation

## 2013-12-31 DIAGNOSIS — S0083XA Contusion of other part of head, initial encounter: Principal | ICD-10-CM

## 2013-12-31 DIAGNOSIS — S1093XA Contusion of unspecified part of neck, initial encounter: Principal | ICD-10-CM

## 2013-12-31 DIAGNOSIS — J449 Chronic obstructive pulmonary disease, unspecified: Secondary | ICD-10-CM | POA: Insufficient documentation

## 2013-12-31 DIAGNOSIS — Y9289 Other specified places as the place of occurrence of the external cause: Secondary | ICD-10-CM | POA: Insufficient documentation

## 2013-12-31 DIAGNOSIS — Y9301 Activity, walking, marching and hiking: Secondary | ICD-10-CM | POA: Insufficient documentation

## 2013-12-31 DIAGNOSIS — IMO0002 Reserved for concepts with insufficient information to code with codable children: Secondary | ICD-10-CM | POA: Insufficient documentation

## 2013-12-31 DIAGNOSIS — S0003XA Contusion of scalp, initial encounter: Secondary | ICD-10-CM | POA: Insufficient documentation

## 2013-12-31 DIAGNOSIS — S0010XA Contusion of unspecified eyelid and periocular area, initial encounter: Secondary | ICD-10-CM | POA: Insufficient documentation

## 2013-12-31 DIAGNOSIS — I10 Essential (primary) hypertension: Secondary | ICD-10-CM | POA: Insufficient documentation

## 2013-12-31 MED ORDER — ACETAMINOPHEN 325 MG PO TABS
650.0000 mg | ORAL_TABLET | Freq: Once | ORAL | Status: AC
  Administered 2013-12-31: 650 mg via ORAL
  Filled 2013-12-31: qty 2

## 2013-12-31 MED ORDER — TRAMADOL HCL 50 MG PO TABS
50.0000 mg | ORAL_TABLET | Freq: Once | ORAL | Status: AC
  Administered 2013-12-31: 50 mg via ORAL
  Filled 2013-12-31: qty 1

## 2013-12-31 MED ORDER — MORPHINE SULFATE 4 MG/ML IJ SOLN
4.0000 mg | Freq: Once | INTRAMUSCULAR | Status: AC
  Administered 2013-12-31: 4 mg via INTRAVENOUS
  Filled 2013-12-31: qty 1

## 2013-12-31 MED ORDER — OXYCODONE-ACETAMINOPHEN 5-325 MG PO TABS: 1.0000 | ORAL_TABLET | Freq: Four times a day (QID) | ORAL | Status: DC | PRN

## 2013-12-31 NOTE — ED Notes (Signed)
Per pt/family, was using bedside commode when she lost footing and fell forward, no LOC

## 2013-12-31 NOTE — ED Notes (Signed)
Pt also complains of R shoulder pain. Pt able to move shoulder

## 2013-12-31 NOTE — Discharge Instructions (Signed)
Take percocet in addition to tramadol. You may be sleepy and unsteady after taking it.   Apply ice to area.   Stop taking aspirin for the next week,.   You are likely to have more facial swelling in the next few days.   Follow up with Dr. Laurann Montana.   Return to ER if you have severe pain, worse headaches, vomiting.

## 2013-12-31 NOTE — ED Notes (Signed)
Bed: ZH08 Expected date:  Expected time:  Means of arrival:  Comments: Hold for triage 3

## 2013-12-31 NOTE — ED Notes (Signed)
Patient transported to CT 

## 2013-12-31 NOTE — ED Provider Notes (Signed)
CSN: [id number]     Arrival date & time 12/31/13  0740 History   First MD Initiated Contact with Patient 12/31/13 [phone]     Chief Complaint  Patient presents with  . Fall   (Consider location/radiation/quality/duration/timing/severity/associated sxs/prior Treatment) The history is provided by the patient and a relative.  Ebony Peterson is a 80 y.o. female hx of asthma, COPD here with s/p fall. She was walking the bathroom and missed a step and fell and hit her head on the table. Denies loss of consciousness or chest pain or syncope. Denies any other injuries. Has some headache afterwards and denies frequent falls.    Past Medical History  Diagnosis Date  . Asthma   . Hypertension   . COPD (chronic obstructive pulmonary disease)   . Breast CA dx'd 1999    left   Past Surgical History  Procedure Laterality Date  . Vaginal hysterectomy     Family History  Problem Relation Age of Onset  . Diabetes Sister   . Diabetes Sister    History  Substance Use Topics  . Smoking status: Never Smoker   . Smokeless tobacco: Never Used  . Alcohol Use: No   OB History   Grav Para Term Preterm Abortions TAB SAB Ect Mult Living                 Review of Systems  Neurological: Positive for headaches.  All other systems reviewed and are negative.    Allergies  Review of patient's allergies indicates no known allergies.  Home Medications   Current Outpatient Rx  Name  Route  Sig  Dispense  Refill  . albuterol (PROAIR HFA) 108 (90 BASE) MCG/ACT inhaler   Inhalation   Inhale 2 puffs into the lungs every 4 (four) hours as needed for wheezing or shortness of breath.          Marland Kitchen aspirin EC 81 MG tablet   Oral   Take 81 mg by mouth every morning.          . ferrous sulfate 325 (65 FE) MG tablet   Oral   Take 325 mg by mouth every morning.         . Fluticasone-Salmeterol (ADVAIR) 100-50 MCG/DOSE AEPB   Inhalation   Inhale 1 puff into the lungs every 12 (twelve) hours.          . Multiple Vitamins-Minerals (CENTRUM ADULTS PO)   Oral   Take 1 tablet by mouth daily.         Marland Kitchen oxybutynin (DITROPAN) 5 MG tablet   Oral   Take 2.5 mg by mouth 3 (three) times daily.          . predniSONE (DELTASONE) 5 MG tablet   Oral   Take 1 tablet (5 mg total) by mouth 2 (two) times daily with a meal.   60 tablet   11   . traMADol (ULTRAM) 50 MG tablet   Oral   Take 50 mg by mouth every 6 (six) hours as needed for pain.          BP 163/108  Pulse 86  Temp(Src) 97.8 F (36.6 C) (Oral)  Resp 18  SpO2 100% Physical Exam  Nursing note and vitals reviewed. Constitutional: She is oriented to person, place, and time.  Chronically ill, holding her head   HENT:  Head: Normocephalic.  Mouth/Throat: Oropharynx is clear and moist.  Obvious hematoma L forehead and L upper eyelid.   Eyes: Conjunctivae are  normal. Pupils are equal, round, and reactive to light.  Extra ocular movements intact despite L upper eyelid swelling.   Neck: Neck supple.  Dec ROM due to pain   Cardiovascular: Normal rate, regular rhythm and normal heart sounds.   Pulmonary/Chest: Effort normal and breath sounds normal. No respiratory distress. She has no wheezes. She has no rales.  Abdominal: Soft. Bowel sounds are normal. She exhibits no distension. There is no tenderness. There is no rebound and no guarding.  Musculoskeletal: Normal range of motion.  Nl ROM bilateral arms and hips   Neurological: She is alert and oriented to person, place, and time. No cranial nerve deficit. Coordination normal.  Skin: Skin is warm and dry.  Psychiatric: She has a normal mood and affect. Her behavior is normal. Judgment and thought content normal.    ED Course  Procedures (including critical care time) Labs Review Labs Reviewed - No data to display Imaging Review Dg Chest 2 View  12/31/2013   CLINICAL DATA:  Fall, COPD  EXAM: CHEST  2 VIEW  COMPARISON:  09/01/2013  FINDINGS: Hyperinflation with chronic  significant left diaphragm elevation. Heart size and vascular pattern normal. Lungs clear. Bony thorax shows no acute findings.  IMPRESSION: COPD with no acute abnormality.   Electronically Signed   By: Skipper Cliche M.D.   On: 12/31/2013 12:11   Ct Head Wo Contrast  12/31/2013   CLINICAL DATA:  Headache, facial and neck pain after fall.  EXAM: CT HEAD WITHOUT CONTRAST  CT MAXILLOFACIAL WITHOUT CONTRAST  CT CERVICAL SPINE WITHOUT CONTRAST  TECHNIQUE: Multidetector CT imaging of the head, cervical spine, and maxillofacial structures were performed using the standard protocol without intravenous contrast. Multiplanar CT image reconstructions of the cervical spine and maxillofacial structures were also generated.  COMPARISON:  CT scan of September 06, 2013.  FINDINGS: CT HEAD FINDINGS  Large scalp hematoma overlies left frontal and parietal skull. Bony calvarium appears intact. Mild diffuse cortical atrophy is noted. Mild chronic ischemic white matter disease is noted. No mass effect or midline shift is noted. Ventricular size is within normal limits. There is no evidence of mass lesion, hemorrhage or acute infarction.  CT MAXILLOFACIAL FINDINGS  Globes and orbits appear normal. Paranasal sinuses appear normal. No fracture or other bony abnormality is noted. There is again noted soft tissue hematoma over the left temporal and parietal region.  CT CERVICAL SPINE FINDINGS  Reversal normal lordosis is noted. There appears to be nearly complete fusion of the C4, C5 and C6 vertebral bodies secondary to degenerative change. Severe degenerative disc disease is also noted at C6-7 and C7-T1 hypertrophy of the posterior facet joints is seen in the upper cervical spine consistent with degenerative joint disease. Mild anterolisthesis is seen at C3-4 secondary to posterior facet joint hypertrophy. No fracture is noted.  IMPRESSION: Large scalp hematoma overlying left frontal and parietal skull. Mild diffuse cortical atrophy is  noted. Chronic ischemic white matter disease is noted. No acute intracranial abnormality seen.  No significant abnormality seen involving the maxillofacial region.  Extensive multilevel degenerative disc disease is noted in the cervical spine. No acute abnormality is noted.   Electronically Signed   By: Sabino Dick M.D.   On: 12/31/2013 08:54   Ct Cervical Spine Wo Contrast  12/31/2013   CLINICAL DATA:  Headache, facial and neck pain after fall.  EXAM: CT HEAD WITHOUT CONTRAST  CT MAXILLOFACIAL WITHOUT CONTRAST  CT CERVICAL SPINE WITHOUT CONTRAST  TECHNIQUE: Multidetector CT imaging of the  head, cervical spine, and maxillofacial structures were performed using the standard protocol without intravenous contrast. Multiplanar CT image reconstructions of the cervical spine and maxillofacial structures were also generated.  COMPARISON:  CT scan of September 06, 2013.  FINDINGS: CT HEAD FINDINGS  Large scalp hematoma overlies left frontal and parietal skull. Bony calvarium appears intact. Mild diffuse cortical atrophy is noted. Mild chronic ischemic white matter disease is noted. No mass effect or midline shift is noted. Ventricular size is within normal limits. There is no evidence of mass lesion, hemorrhage or acute infarction.  CT MAXILLOFACIAL FINDINGS  Globes and orbits appear normal. Paranasal sinuses appear normal. No fracture or other bony abnormality is noted. There is again noted soft tissue hematoma over the left temporal and parietal region.  CT CERVICAL SPINE FINDINGS  Reversal normal lordosis is noted. There appears to be nearly complete fusion of the C4, C5 and C6 vertebral bodies secondary to degenerative change. Severe degenerative disc disease is also noted at C6-7 and C7-T1 hypertrophy of the posterior facet joints is seen in the upper cervical spine consistent with degenerative joint disease. Mild anterolisthesis is seen at C3-4 secondary to posterior facet joint hypertrophy. No fracture is noted.   IMPRESSION: Large scalp hematoma overlying left frontal and parietal skull. Mild diffuse cortical atrophy is noted. Chronic ischemic white matter disease is noted. No acute intracranial abnormality seen.  No significant abnormality seen involving the maxillofacial region.  Extensive multilevel degenerative disc disease is noted in the cervical spine. No acute abnormality is noted.   Electronically Signed   By: Sabino Dick M.D.   On: 12/31/2013 08:54   Dg Shoulder Left  12/31/2013   CLINICAL DATA:  Fall  EXAM: LEFT SHOULDER - 2+ VIEW  COMPARISON:  None.  FINDINGS: No acute fracture. No dislocation. Chronic superior subluxation of the humeral head with respect to the glenoid compatible with rotator cuff injury.  IMPRESSION: No acute bony pathology.  Chronic rotator cuff injury is suspected.   Electronically Signed   By: Maryclare Bean M.D.   On: 12/31/2013 12:08   Ct Maxillofacial Wo Cm  12/31/2013   CLINICAL DATA:  Headache, facial and neck pain after fall.  EXAM: CT HEAD WITHOUT CONTRAST  CT MAXILLOFACIAL WITHOUT CONTRAST  CT CERVICAL SPINE WITHOUT CONTRAST  TECHNIQUE: Multidetector CT imaging of the head, cervical spine, and maxillofacial structures were performed using the standard protocol without intravenous contrast. Multiplanar CT image reconstructions of the cervical spine and maxillofacial structures were also generated.  COMPARISON:  CT scan of September 06, 2013.  FINDINGS: CT HEAD FINDINGS  Large scalp hematoma overlies left frontal and parietal skull. Bony calvarium appears intact. Mild diffuse cortical atrophy is noted. Mild chronic ischemic white matter disease is noted. No mass effect or midline shift is noted. Ventricular size is within normal limits. There is no evidence of mass lesion, hemorrhage or acute infarction.  CT MAXILLOFACIAL FINDINGS  Globes and orbits appear normal. Paranasal sinuses appear normal. No fracture or other bony abnormality is noted. There is again noted soft tissue  hematoma over the left temporal and parietal region.  CT CERVICAL SPINE FINDINGS  Reversal normal lordosis is noted. There appears to be nearly complete fusion of the C4, C5 and C6 vertebral bodies secondary to degenerative change. Severe degenerative disc disease is also noted at C6-7 and C7-T1 hypertrophy of the posterior facet joints is seen in the upper cervical spine consistent with degenerative joint disease. Mild anterolisthesis is seen at C3-4 secondary to  posterior facet joint hypertrophy. No fracture is noted.  IMPRESSION: Large scalp hematoma overlying left frontal and parietal skull. Mild diffuse cortical atrophy is noted. Chronic ischemic white matter disease is noted. No acute intracranial abnormality seen.  No significant abnormality seen involving the maxillofacial region.  Extensive multilevel degenerative disc disease is noted in the cervical spine. No acute abnormality is noted.   Electronically Signed   By: Sabino Dick M.D.   On: 12/31/2013 08:54    EKG Interpretation   None       MDM  No diagnosis found. RENELLA STEIG is a 80 y.o. female here with s/p fall. Mechanical fall with obvious L forehead hematoma. Not on coumadin, plavix. Has headache so will do imaging.   12:23 PM Xray showed scalp hematoma. Otherwise, no other fractures. Pain improved with pain meds. No subdural. Stable to d/c. Will add percocet to tramadol. Will have her stop aspirin for now.   Wandra Arthurs, MD 12/31/13 1224

## 2014-03-09 ENCOUNTER — Emergency Department (HOSPITAL_COMMUNITY)
Admission: EM | Admit: 2014-03-09 | Discharge: 2014-03-09 | Disposition: A | Discharge status: Home / Self Care | Payer: Medicare Other | Attending: Emergency Medicine | Admitting: Emergency Medicine

## 2014-03-09 ENCOUNTER — Emergency Department (HOSPITAL_COMMUNITY): Payer: Medicare Other

## 2014-03-09 ENCOUNTER — Encounter (HOSPITAL_COMMUNITY): Payer: Self-pay | Admitting: Emergency Medicine

## 2014-03-09 DIAGNOSIS — Z853 Personal history of malignant neoplasm of breast: Secondary | ICD-10-CM | POA: Insufficient documentation

## 2014-03-09 DIAGNOSIS — J45909 Unspecified asthma, uncomplicated: Secondary | ICD-10-CM | POA: Insufficient documentation

## 2014-03-09 DIAGNOSIS — I10 Essential (primary) hypertension: Secondary | ICD-10-CM | POA: Insufficient documentation

## 2014-03-09 DIAGNOSIS — R519 Headache, unspecified: Secondary | ICD-10-CM

## 2014-03-09 DIAGNOSIS — J4489 Other specified chronic obstructive pulmonary disease: Secondary | ICD-10-CM | POA: Insufficient documentation

## 2014-03-09 DIAGNOSIS — Z79899 Other long term (current) drug therapy: Secondary | ICD-10-CM | POA: Insufficient documentation

## 2014-03-09 DIAGNOSIS — R51 Headache: Secondary | ICD-10-CM | POA: Insufficient documentation

## 2014-03-09 DIAGNOSIS — J449 Chronic obstructive pulmonary disease, unspecified: Secondary | ICD-10-CM | POA: Insufficient documentation

## 2014-03-09 DIAGNOSIS — IMO0002 Reserved for concepts with insufficient information to code with codable children: Secondary | ICD-10-CM | POA: Insufficient documentation

## 2014-03-09 DIAGNOSIS — R079 Chest pain, unspecified: Secondary | ICD-10-CM | POA: Insufficient documentation

## 2014-03-09 DIAGNOSIS — Z7982 Long term (current) use of aspirin: Secondary | ICD-10-CM | POA: Insufficient documentation

## 2014-03-09 DIAGNOSIS — R634 Abnormal weight loss: Secondary | ICD-10-CM | POA: Insufficient documentation

## 2014-03-09 LAB — CBC
HCT: 36.7 % (ref 36.0–46.0)
Hemoglobin: 12.7 g/dL (ref 12.0–15.0)
MCH: 32.8 pg (ref 26.0–34.0)
MCHC: 34.6 g/dL (ref 30.0–36.0)
MCV: 94.8 fL (ref 78.0–100.0)
Platelets: 267 10*3/uL (ref 150–400)
RBC: 3.87 MIL/uL (ref 3.87–5.11)
RDW: 13.3 % (ref 11.5–15.5)
WBC: 8.3 10*3/uL (ref 4.0–10.5)

## 2014-03-09 LAB — BASIC METABOLIC PANEL
BUN: 19 mg/dL (ref 6–23)
CO2: 31 mEq/L (ref 19–32)
Calcium: 9.5 mg/dL (ref 8.4–10.5)
Chloride: 105 mEq/L (ref 96–112)
Creatinine, Ser: 1.04 mg/dL (ref 0.50–1.10)
GFR calc Af Amer: 52 mL/min — ABNORMAL LOW (ref >90)
GFR calc non Af Amer: 45 mL/min — ABNORMAL LOW (ref >90)
Glucose, Bld: 75 mg/dL (ref 70–99)
POTASSIUM: 3.9 meq/L (ref 3.7–5.3)
SODIUM: 147 meq/L (ref 137–147)

## 2014-03-09 LAB — I-STAT TROPONIN, ED: TROPONIN I, POC: 0.01 ng/mL (ref 0.00–0.08)

## 2014-03-09 NOTE — Discharge Instructions (Signed)
Use Tylenol, for pain. See, your doctor, for a checkup in one week, sooner if needed, for problems.     Chest Pain (Nonspecific) It is often hard to give a specific diagnosis for the cause of chest pain. There is always a chance that your pain could be related to something serious, such as a heart attack or a blood clot in the lungs. You need to follow up with your caregiver for further evaluation. CAUSES   Heartburn.  Pneumonia or bronchitis.  Anxiety or stress.  Inflammation around your heart (pericarditis) or lung (pleuritis or pleurisy).  A blood clot in the lung.  A collapsed lung (pneumothorax). It can develop suddenly on its own (spontaneous pneumothorax) or from injury (trauma) to the chest.  Shingles infection (herpes zoster virus). The chest wall is composed of bones, muscles, and cartilage. Any of these can be the source of the pain.  The bones can be bruised by injury.  The muscles or cartilage can be strained by coughing or overwork.  The cartilage can be affected by inflammation and become sore (costochondritis). DIAGNOSIS  Lab tests or other studies, such as X-rays, electrocardiography, stress testing, or cardiac imaging, may be needed to find the cause of your pain.  TREATMENT   Treatment depends on what may be causing your chest pain. Treatment may include:  Acid blockers for heartburn.  Anti-inflammatory medicine.  Pain medicine for inflammatory conditions.  Antibiotics if an infection is present.  You may be advised to change lifestyle habits. This includes stopping smoking and avoiding alcohol, caffeine, and chocolate.  You may be advised to keep your head raised (elevated) when sleeping. This reduces the chance of acid going backward from your stomach into your esophagus.  Most of the time, nonspecific chest pain will improve within 2 to 3 days with rest and mild pain medicine. HOME CARE INSTRUCTIONS   If antibiotics were prescribed, take your  antibiotics as directed. Finish them even if you start to feel better.  For the next few days, avoid physical activities that bring on chest pain. Continue physical activities as directed.  Do not smoke.  Avoid drinking alcohol.  Only take over-the-counter or prescription medicine for pain, discomfort, or fever as directed by your caregiver.  Follow your caregiver's suggestions for further testing if your chest pain does not go away.  Keep any follow-up appointments you made. If you do not go to an appointment, you could develop lasting (chronic) problems with pain. If there is any problem keeping an appointment, you must call to reschedule. SEEK MEDICAL CARE IF:   You think you are having problems from the medicine you are taking. Read your medicine instructions carefully.  Your chest pain does not go away, even after treatment.  You develop a rash with blisters on your chest. SEEK IMMEDIATE MEDICAL CARE IF:   You have increased chest pain or pain that spreads to your arm, neck, jaw, back, or abdomen.  You develop shortness of breath, an increasing cough, or you are coughing up blood.  You have severe back or abdominal pain, feel nauseous, or vomit.  You develop severe weakness, fainting, or chills.  You have a fever. THIS IS AN EMERGENCY. Do not wait to see if the pain will go away. Get medical help at once. Call your local emergency services (911 in [place] Do not drive yourself to the hospital. MAKE SURE YOU:   Understand these instructions.  Will watch your condition.  Will get help right away if  you are not doing well or get worse. Document Released: 09/11/2005 Document Revised: 02/24/2012 Document Reviewed: 07/07/2008 [facility] Patient Information 2014 [place].  Pain of Unknown Etiology (Pain Without a Known Cause) You have come to your caregiver because of pain. Pain can occur in any part of the body. Often there is not a definite cause. If your laboratory  (blood or urine) work was normal and X-rays or other studies were normal, your caregiver may treat you without knowing the cause of the pain. An example of this is the headache. Most headaches are diagnosed by taking a history. This means your caregiver asks you questions about your headaches. Your caregiver determines a treatment based on your answers. Usually testing done for headaches is normal. Often testing is not done unless there is no response to medications. Regardless of where your pain is located today, you can be given medications to make you comfortable. If no physical cause of pain can be found, most cases of pain will gradually leave as suddenly as they came.  If you have a painful condition and no reason can be found for the pain, it is important that you follow up with your caregiver. If the pain becomes worse or does not go away, it may be necessary to repeat tests and look further for a possible cause.  Only take over-the-counter or prescription medicines for pain, discomfort, or fever as directed by your caregiver.  For the protection of your privacy, test results cannot be given over the phone. Make sure you receive the results of your test. Ask how these results are to be obtained if you have not been informed. It is your responsibility to obtain your test results.  You may continue all activities unless the activities cause more pain. When the pain lessens, it is important to gradually resume normal activities. Resume activities by beginning slowly and gradually increasing the intensity and duration of the activities or exercise. During periods of severe pain, bed rest may be helpful. Lie or sit in any position that is comfortable.  Ice used for acute (sudden) conditions may be effective. Use a large plastic bag filled with ice and wrapped in a towel. This may provide pain relief.  See your caregiver for continued problems. Your caregiver can help or refer you for exercises or  physical therapy if necessary. If you were given medications for your condition, do not drive, operate machinery or power tools, or sign legal documents for 24 hours. Do not drink alcohol, take sleeping pills, or take other medications that may interfere with treatment. See your caregiver immediately if you have pain that is becoming worse and not relieved by medications. Document Released: 08/27/2001 Document Revised: 09/22/2013 Document Reviewed: 12/02/2005 [facility] Patient Information 2014 [place].

## 2014-03-09 NOTE — ED Provider Notes (Signed)
CSN: [id number]     Arrival date & time 03/09/14  1027 History   First MD Initiated Contact with Patient 03/09/14 0900     Chief Complaint  Patient presents with  . Chest Pain     (Consider location/radiation/quality/duration/timing/severity/associated sxs/prior Treatment) Patient is a 80 y.o. female presenting with chest pain. The history is provided by the patient.  Chest Pain  She complains of pain in the right side of her head. That started at rest. This morning and radiated to her left chest. The pain wass mild in intensity, and sore in nature. The discomfort has resolved. There were no other associated symptoms. She complains of weight loss, chronically. She also has chronic mucus production that "just comes up, and I have to pull it out of my mouth." She denies fever, chills, cough, shortness of breath weakness dizziness, nausea, vomiting, diarrhea. She has been able to eat and denies anorexia. She saw her PCP a couple weeks ago for a routine checkup. There are no other known modifying factors.  Past Medical History  Diagnosis Date  . Asthma   . Hypertension   . COPD (chronic obstructive pulmonary disease)   . Breast CA dx'd 1999    left   Past Surgical History  Procedure Laterality Date  . Vaginal hysterectomy     Family History  Problem Relation Age of Onset  . Diabetes Sister   . Diabetes Sister    History  Substance Use Topics  . Smoking status: Never Smoker   . Smokeless tobacco: Never Used  . Alcohol Use: No   OB History   Grav Para Term Preterm Abortions TAB SAB Ect Mult Living                 Review of Systems  Cardiovascular: Positive for chest pain.  All other systems reviewed and are negative.      Allergies  Review of patient's allergies indicates no known allergies.  Home Medications   Current Outpatient Rx  Name  Route  Sig  Dispense  Refill  . albuterol (PROAIR HFA) 108 (90 BASE) MCG/ACT inhaler   Inhalation   Inhale 2 puffs into the  lungs every 4 (four) hours as needed for wheezing or shortness of breath.          . ferrous sulfate 325 (65 FE) MG tablet   Oral   Take 325 mg by mouth every morning.         . Fluticasone-Salmeterol (ADVAIR) 100-50 MCG/DOSE AEPB   Inhalation   Inhale 1 puff into the lungs every 12 (twelve) hours.         . Multiple Vitamins-Minerals (CENTRUM ADULTS PO)   Oral   Take 1 tablet by mouth daily.         Marland Kitchen oxybutynin (DITROPAN) 5 MG tablet   Oral   Take 2.5 mg by mouth 3 (three) times daily.          . predniSONE (DELTASONE) 5 MG tablet   Oral   Take 1 tablet (5 mg total) by mouth 2 (two) times daily with a meal.   60 tablet   11   . traMADol (ULTRAM) 50 MG tablet   Oral   Take 50 mg by mouth every 6 (six) hours as needed for pain.         Marland Kitchen aspirin EC 81 MG tablet   Oral   Take 324 mg by mouth every morning.  BP 162/77  Pulse 82  Resp 26  SpO2 98% Physical Exam  Nursing note and vitals reviewed. Constitutional: She is oriented to person, place, and time. She appears well-developed.  Elderly, frail  HENT:  Head: Normocephalic and atraumatic.  Eyes: Conjunctivae and EOM are normal. Pupils are equal, round, and reactive to light.  Neck: Normal range of motion and phonation normal. Neck supple.  Cardiovascular: Normal rate, regular rhythm and intact distal pulses.   Pulmonary/Chest: Effort normal and breath sounds normal. She exhibits no tenderness.  Abdominal: Soft. She exhibits no distension. There is no tenderness. There is no guarding.  She produces mucus, and wipes her mouth to clear it frequently; it is clear and thin.  Musculoskeletal: Normal range of motion.  Neurological: She is alert and oriented to person, place, and time. She exhibits normal muscle tone.  Skin: Skin is warm and dry.  Psychiatric: She has a normal mood and affect. Her behavior is normal. Judgment and thought content normal.    ED Course  Procedures (including critical  care time) Medications - No data to display  Patient Vitals for the past 24 hrs:  BP Pulse Resp SpO2  03/09/14 1300 162/77 mmHg 82 - 98 %  03/09/14 1245 181/78 mmHg 82 - 99 %  03/09/14 1230 160/72 mmHg 81 - 99 %  03/09/14 1215 185/100 mmHg 81 - 99 %  03/09/14 1200 192/85 mmHg 82 - 98 %  03/09/14 1145 177/83 mmHg 77 - 97 %  03/09/14 1130 183/77 mmHg 75 - 99 %  03/09/14 1115 177/81 mmHg 80 - 98 %  03/09/14 1100 176/75 mmHg 68 - 98 %  03/09/14 1045 204/88 mmHg 78 - 99 %  03/09/14 1030 190/76 mmHg 77 26 99 %  03/09/14 1015 188/107 mmHg 78 21 100 %  03/09/14 1000 196/87 mmHg 69 12 100 %  03/09/14 0945 183/107 mmHg - 15 99 %  03/09/14 0930 225/98 mmHg 79 15 99 %  03/09/14 0915 196/102 mmHg 73 21 97 %    At Discharge- Reevaluation with update and discussion. After initial assessment and treatment, an updated evaluation reveals No further c/o. Her daughter is here now, and states that the patient has chronic symptoms of spitting up. She feels that the patient may have been "crying wolf, this morning". [place] Review Labs Reviewed  BASIC METABOLIC PANEL - Abnormal; Notable for the following:    GFR calc non Af Amer 45 (*)    GFR calc Af Amer 52 (*)    All other components within normal limits  CBC  I-STAT TROPOININ, ED   Imaging Review Dg Chest Port 1 View  03/09/2014   CLINICAL DATA:  Chest pain and cough  EXAM: PORTABLE CHEST - 1 VIEW  COMPARISON:  DG CHEST 2 VIEW dated 12/31/2013  FINDINGS: Normal cardiac silhouette. Chronic elevation left hemidiaphragm. Lungs are hyperinflated. No effusion, infiltrate, pneumothorax.  IMPRESSION: 1.  No acute cardiopulmonary process. 2. Hyperinflated lungs.   Electronically Signed   By: Suzy Bouchard M.D.   On: 03/09/2014 09:21     EKG Interpretation   Date/Time:  Wednesday March 09 2014 09:07:01 EDT Ventricular Rate:  73 PR Interval:  154 QRS Duration: 74 QT Interval:  455 QTC Calculation: 501 R Axis:   84 Text  Interpretation:  Sinus rhythm Consider right atrial enlargement  Borderline right axis deviation Consider left ventricular hypertrophy  Repol abnrm suggests ischemia, inferior leads Prolonged QT interval  Artifact in lead(s) I  III aVR aVL aVF V1 V3 V4 V5 Since last tracing QT  has lengthened Confirmed by Eulis Foster  MD, Lindwood Mogel [phone]) on 03/09/2014  10:12:11 AM      MDM   Final diagnoses:  Headache  Nonspecific chest pain    Nonspecific headache, and chest pain with ongoing and chronic symptoms of esophageal reflux. Doubt ACS, PE, or pneumonia. Doubt CVA.  Nursing Notes Reviewed/ Care Coordinated Applicable Imaging Reviewed Interpretation of Laboratory Data incorporated into ED treatment  The patient appears reasonably screened and/or stabilized for discharge and I doubt any other medical condition or other [facility] requiring further screening, evaluation, or treatment in the ED at this time prior to discharge.  Plan: Home Medications- usual; Home Treatments- rest; return here if the recommended treatment, does not improve the symptoms; Recommended follow up- PCP, for check up next week     Richarda Blade, MD 03/09/14 1730

## 2014-03-09 NOTE — ED Notes (Signed)
Per [facility] pt from home, pt c/o sudden onset of Right side chest pain radiating up into her Right jaw, to her Left chest pain then subsided. Pt is currently chest pain free, no nitro given but pt did take ASA 324 mg prior to EMS arrival. Pt denies diaphoresis, nausea, abd/back pain, or SOB. Pt alert to her norm. BP 208/106, HR 70, RR 16

## 2014-05-05 IMAGING — CR DG CHEST 2V
2 series · 2 of 2 positions shown · non-contrast
Comparison: 05/21/2013 and earlier.

CLINICAL DATA: [AGE] female shortness of Breath. Weakness.
Chronic pulmonary disease.

EXAM:
CHEST  2 VIEW

[w chest lat]
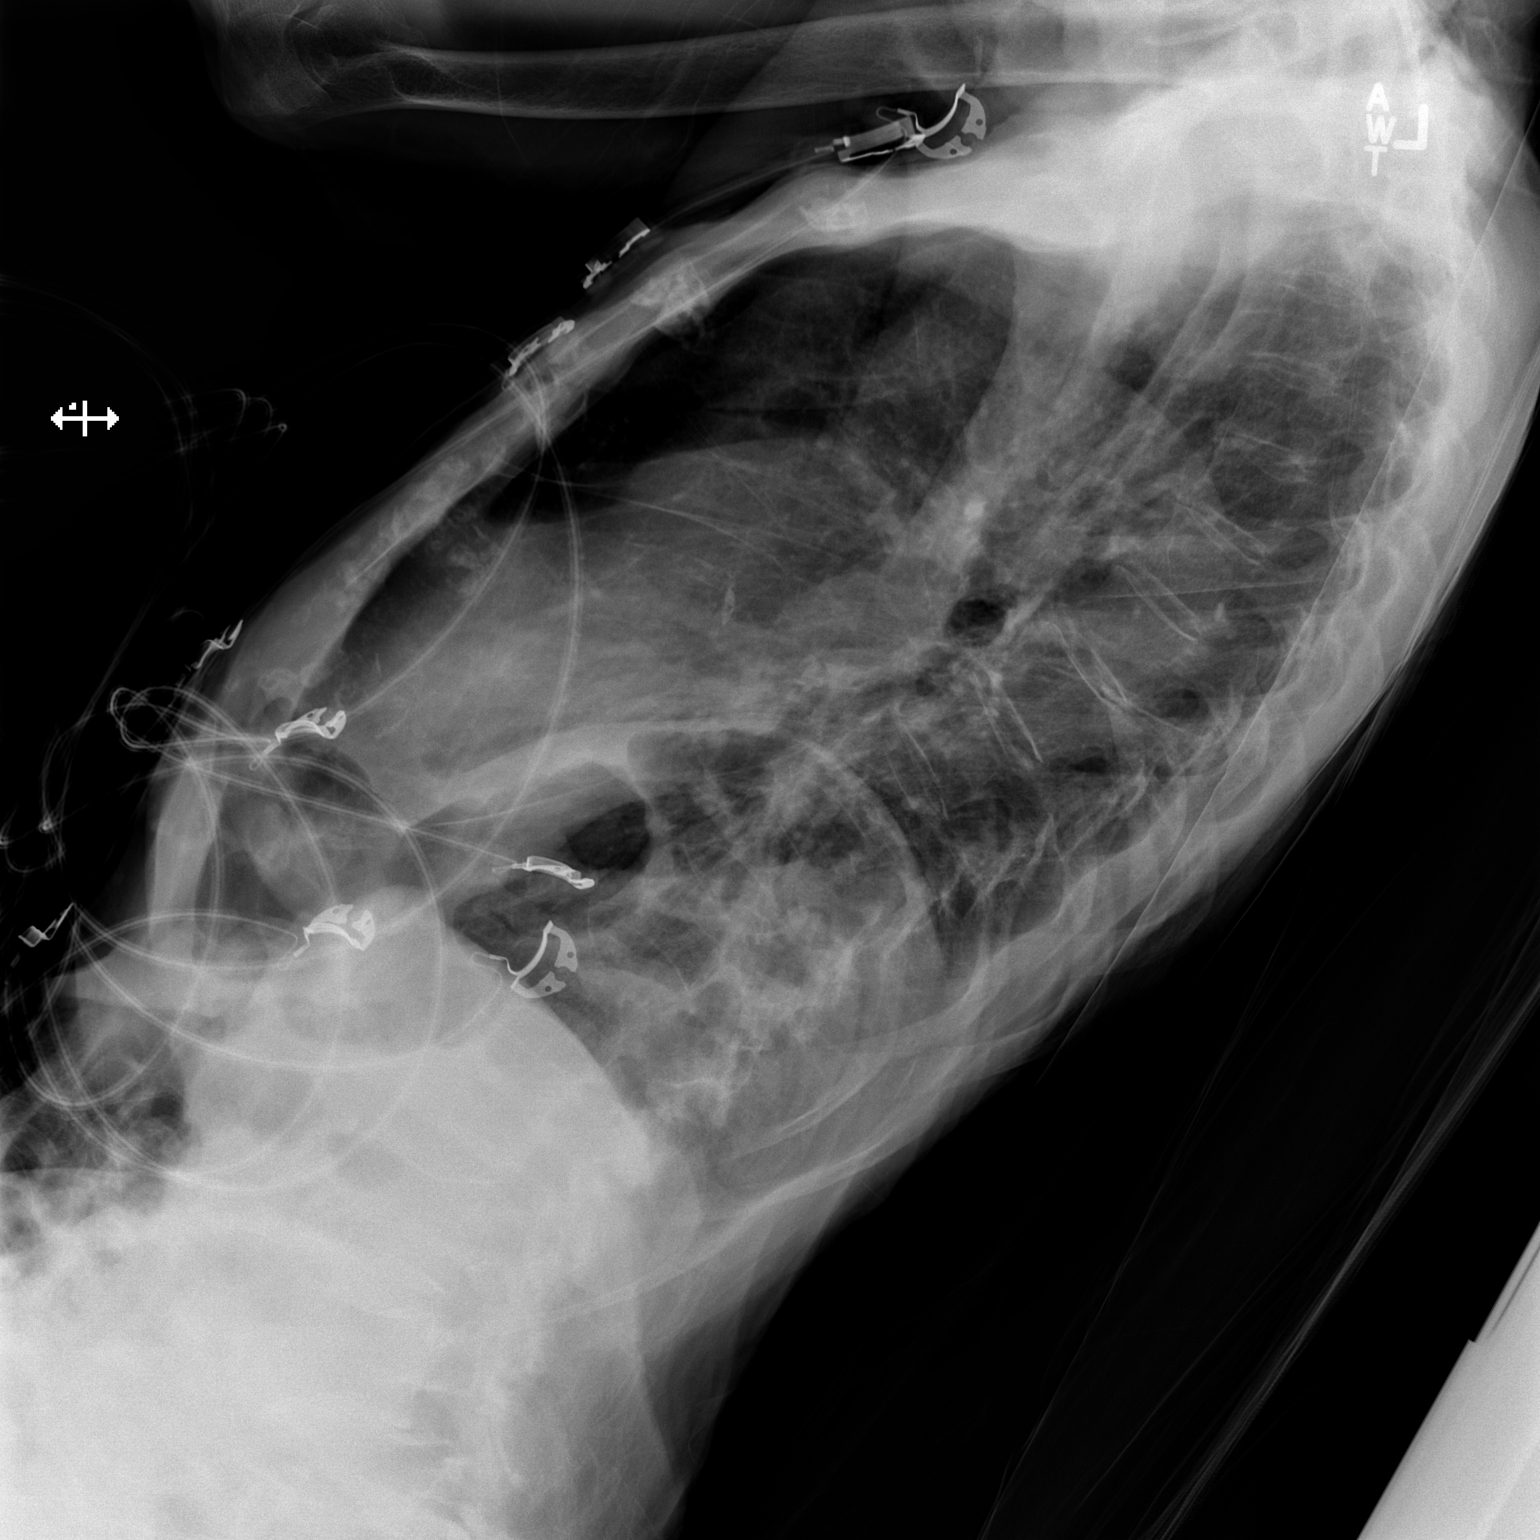

[x chest ap]
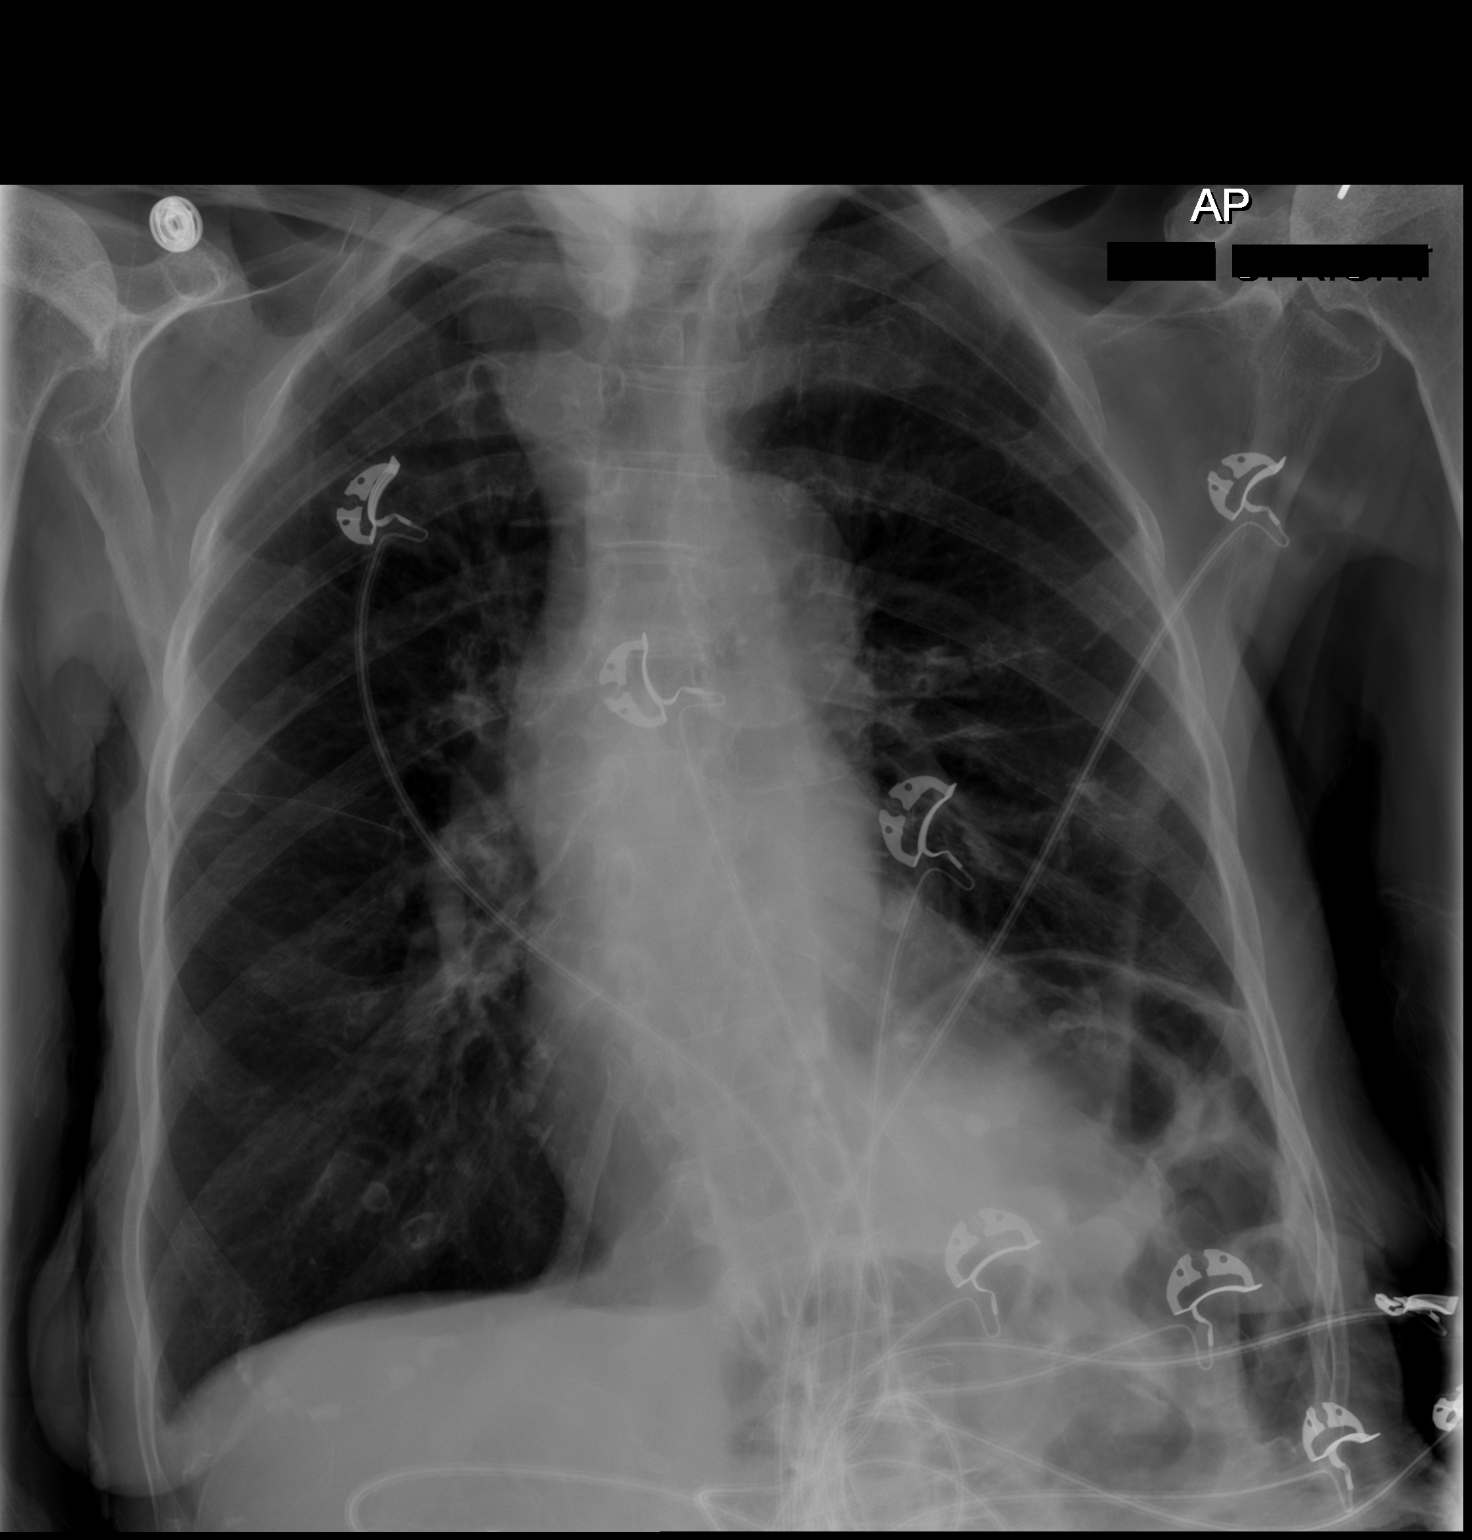

[2 of 2 positions shown; findings below may reference images not displayed]

FINDINGS: Semi upright AP and lateral views of the chest. Stable lung volumes,
large but with chronic elevation of the left hemidiaphragm. Stable
cardiac size and mediastinal contours. Visualized tracheal air
column is within normal limits. No pneumothorax or pulmonary edema.
No pleural effusion or acute pulmonary opacity. Osteopenia. No
definite acute osseous abnormality.
IMPRESSION: Stable chronic lung disease. No superimposed acute findings are
identified.

## 2014-08-01 ENCOUNTER — Emergency Department (HOSPITAL_COMMUNITY): Payer: Medicare Other

## 2014-08-01 ENCOUNTER — Encounter (HOSPITAL_COMMUNITY): Payer: Self-pay | Admitting: Emergency Medicine

## 2014-08-01 ENCOUNTER — Emergency Department (HOSPITAL_COMMUNITY)
Admission: EM | Admit: 2014-08-01 | Discharge: 2014-08-01 | Disposition: A | Discharge status: Home / Self Care | Payer: Medicare Other | Attending: Emergency Medicine | Admitting: Emergency Medicine

## 2014-08-01 DIAGNOSIS — J449 Chronic obstructive pulmonary disease, unspecified: Secondary | ICD-10-CM | POA: Insufficient documentation

## 2014-08-01 DIAGNOSIS — J4489 Other specified chronic obstructive pulmonary disease: Secondary | ICD-10-CM | POA: Insufficient documentation

## 2014-08-01 DIAGNOSIS — Z8719 Personal history of other diseases of the digestive system: Secondary | ICD-10-CM | POA: Diagnosis not present

## 2014-08-01 DIAGNOSIS — I10 Essential (primary) hypertension: Secondary | ICD-10-CM

## 2014-08-01 DIAGNOSIS — I129 Hypertensive chronic kidney disease with stage 1 through stage 4 chronic kidney disease, or unspecified chronic kidney disease: Secondary | ICD-10-CM | POA: Insufficient documentation

## 2014-08-01 DIAGNOSIS — N189 Chronic kidney disease, unspecified: Secondary | ICD-10-CM | POA: Insufficient documentation

## 2014-08-01 DIAGNOSIS — Z853 Personal history of malignant neoplasm of breast: Secondary | ICD-10-CM | POA: Diagnosis not present

## 2014-08-01 DIAGNOSIS — Z79899 Other long term (current) drug therapy: Secondary | ICD-10-CM | POA: Diagnosis not present

## 2014-08-01 DIAGNOSIS — R0789 Other chest pain: Secondary | ICD-10-CM | POA: Insufficient documentation

## 2014-08-01 DIAGNOSIS — R079 Chest pain, unspecified: Secondary | ICD-10-CM | POA: Diagnosis present

## 2014-08-01 DIAGNOSIS — Z8619 Personal history of other infectious and parasitic diseases: Secondary | ICD-10-CM | POA: Diagnosis not present

## 2014-08-01 LAB — BASIC METABOLIC PANEL
ANION GAP: 11 (ref 5–15)
BUN: 19 mg/dL (ref 6–23)
CALCIUM: 9.4 mg/dL (ref 8.4–10.5)
CO2: 35 mEq/L — ABNORMAL HIGH (ref 19–32)
Chloride: 106 mEq/L (ref 96–112)
Creatinine, Ser: 1.15 mg/dL — ABNORMAL HIGH (ref 0.50–1.10)
GFR calc Af Amer: 46 mL/min — ABNORMAL LOW (ref >90)
GFR, EST NON AFRICAN AMERICAN: 39 mL/min — AB (ref >90)
Glucose, Bld: 96 mg/dL (ref 70–99)
Potassium: 3.6 mEq/L — ABNORMAL LOW (ref 3.7–5.3)
SODIUM: 152 meq/L — AB (ref 137–147)

## 2014-08-01 LAB — CBC
HCT: 37.5 % (ref 36.0–46.0)
Hemoglobin: 12.2 g/dL (ref 12.0–15.0)
MCH: 31.1 pg (ref 26.0–34.0)
MCHC: 32.5 g/dL (ref 30.0–36.0)
MCV: 95.7 fL (ref 78.0–100.0)
PLATELETS: 232 10*3/uL (ref 150–400)
RBC: 3.92 MIL/uL (ref 3.87–5.11)
RDW: 13.8 % (ref 11.5–15.5)
WBC: 8.7 10*3/uL (ref 4.0–10.5)

## 2014-08-01 LAB — I-STAT TROPONIN, ED
Troponin i, poc: 0.02 ng/mL (ref 0.00–0.08)
Troponin i, poc: 0.03 ng/mL (ref 0.00–0.08)

## 2014-08-01 LAB — PRO B NATRIURETIC PEPTIDE: Pro B Natriuretic peptide (BNP): 653 pg/mL — ABNORMAL HIGH (ref 0–450)

## 2014-08-01 MED ORDER — SODIUM CHLORIDE 0.9 % IV BOLUS (SEPSIS)
200.0000 mL | Freq: Once | INTRAVENOUS | Status: AC
  Administered 2014-08-01: 200 mL via INTRAVENOUS

## 2014-08-01 MED ORDER — NITROGLYCERIN 2 % TD OINT
1.0000 [in_us] | TOPICAL_OINTMENT | Freq: Once | TRANSDERMAL | Status: AC
  Administered 2014-08-01: 1 [in_us] via TOPICAL
  Filled 2014-08-01: qty 1

## 2014-08-01 MED ORDER — MORPHINE SULFATE 2 MG/ML IJ SOLN
2.0000 mg | Freq: Once | INTRAMUSCULAR | Status: AC
  Administered 2014-08-01: 2 mg via INTRAVENOUS
  Filled 2014-08-01: qty 1

## 2014-08-01 MED ORDER — AMLODIPINE BESYLATE 5 MG PO TABS: 5.0000 mg | ORAL_TABLET | Freq: Every day | ORAL | Status: AC

## 2014-08-01 MED ORDER — ASPIRIN 81 MG PO CHEW: 81.0000 mg | CHEWABLE_TABLET | Freq: Once | ORAL | Status: DC

## 2014-08-01 MED ORDER — ONDANSETRON HCL 4 MG/2ML IJ SOLN
4.0000 mg | Freq: Four times a day (QID) | INTRAMUSCULAR | Status: DC | PRN
  Filled 2014-08-01: qty 2

## 2014-08-01 MED ORDER — LABETALOL HCL 5 MG/ML IV SOLN
10.0000 mg | Freq: Once | INTRAVENOUS | Status: AC
  Administered 2014-08-01: 10 mg via INTRAVENOUS
  Filled 2014-08-01: qty 4

## 2014-08-01 NOTE — ED Notes (Signed)
Called pt daughter per pt request. Pt daughter to come up here, waiting on AAA.

## 2014-08-01 NOTE — Discharge Instructions (Signed)
Start the norvasc again for your blood pressure. Have her rechecked in the next day or two by her doctor. Return to the ED if she seems worse.

## 2014-08-01 NOTE — ED Notes (Signed)
Pt placed on 2L [place] for comfort.

## 2014-08-01 NOTE — ED Provider Notes (Signed)
CSN: [id number]     Arrival date & time 08/01/14  1301 History   First MD Initiated Contact with Patient 08/01/14 1322     Chief Complaint  Patient presents with  . Chest Pain    Level V caveat due to age   (Consider location/radiation/quality/duration/timing/severity/associated sxs/prior Treatment) HPI patient reports this morning she started having severe pain in her jaw bilaterally with some mild left lateral chest discomfort. The pain does not radiate into her arms. She states she felt short of breath but denies diaphoresis. She states she's had this pain before but not as bad as today. She states she was not doing anything when the pain started. She can only describe it as "pain". She denies any nausea, vomiting, or cough. She has chronic swelling of her lower legs. She states they stopped her blood pressure medication a few months ago.   PCP Dr Laurann [place]  Past Medical History  Diagnosis Date  . Asthma   . Hypertension   . COPD (chronic obstructive pulmonary disease)   . Breast CA dx'd 1999    left  . Chronic kidney disease   . Chronic edema   . Breast cancer, left breast   . Overactive bladder   . Chronic gastritis   . Abdominal pain   . Herpes zoster   . Dysphagia   . Abnormal findings on esophagogastroduodenoscopy (EGD)    Past Surgical History  Procedure Laterality Date  . Vaginal hysterectomy     Family History  Problem Relation Age of Onset  . Diabetes Sister   . Diabetes Sister    History  Substance Use Topics  . Smoking status: Never Smoker   . Smokeless tobacco: Never Used  . Alcohol Use: No   Lives at home Live with daughter Has a cane and walker  OB History   Grav Para Term Preterm Abortions TAB SAB Ect Mult Living                 Review of Systems  All other systems reviewed and are negative.     Allergies  Tramadol  Home Medications   Prior to Admission medications   Medication Sig Start Date End Date Taking? Authorizing Provider   acetaminophen (TYLENOL) 500 MG tablet Take 500-1,000 mg by mouth every 6 (six) hours as needed for mild pain.   Yes Historical Provider, MD  albuterol (PROAIR HFA) 108 (90 BASE) MCG/ACT inhaler Inhale 2 puffs into the lungs every 4 (four) hours as needed for wheezing or shortness of breath.    Yes Historical Provider, MD  ferrous sulfate 325 (65 FE) MG tablet Take 325 mg by mouth every other day.  01/31/13  Yes Kalman Drape, MD  Fluticasone-Salmeterol (ADVAIR) 100-50 MCG/DOSE AEPB Inhale 1 puff into the lungs every 12 (twelve) hours.   Yes Historical Provider, MD  oxybutynin (DITROPAN) 5 MG tablet Take 2.5 mg by mouth every 8 (eight) hours as needed for bladder spasms.    Yes Historical Provider, MD   BP 209/106  Pulse 101  Temp(Src) 98.2 F (36.8 C) (Oral)  Resp 24  SpO2 97%  Vital signs normal except hypertension and tachycardia  Physical Exam  Nursing note and vitals reviewed. Constitutional: She is oriented to person, place, and time.  Non-toxic appearance. She does not appear ill. No distress.  Elderly frail female   HENT:  Head: Normocephalic and atraumatic.  Right Ear: External ear normal.  Left Ear: External ear normal.  Nose: Nose normal. No mucosal  edema or rhinorrhea.  Mouth/Throat: Oropharynx is clear and moist and mucous membranes are normal. No dental abscesses or uvula swelling.  No upper teeth, lower teeth intact  Eyes: Conjunctivae and EOM are normal. Pupils are equal, round, and reactive to light.  Neck: Normal range of motion and full passive range of motion without pain. Neck supple.  Cardiovascular: Normal rate, regular rhythm and normal heart sounds.  Exam reveals no gallop and no friction rub.   No murmur heard. Very hyperdynamic heart  Pulmonary/Chest: Effort normal and breath sounds normal. No respiratory distress. She has no wheezes. She has no rhonchi. She has no rales. She exhibits no tenderness and no crepitus.  Abdominal: Soft. Normal appearance and  bowel sounds are normal. She exhibits no distension. There is no tenderness. There is no rebound and no guarding.  Musculoskeletal: Normal range of motion. She exhibits no edema and no tenderness.  Moves all extremities well.   Neurological: She is alert and oriented to person, place, and time. She has normal strength. No cranial nerve deficit.  Skin: Skin is warm, dry and intact. No rash noted. No erythema. No pallor.  Psychiatric: She has a normal mood and affect. Her speech is normal and behavior is normal. Her mood appears not anxious.    ED Course  Procedures (including critical care time)  Medications  aspirin chewable tablet 81 mg (81 mg Oral Not Given 08/01/14 1338)  ondansetron (ZOFRAN) injection 4 mg (not administered)  nitroGLYCERIN (NITROGLYN) 2 % ointment 1 inch (1 inch Topical Given 08/01/14 1338)  labetalol (NORMODYNE,TRANDATE) injection 10 mg (10 mg Intravenous Given 08/01/14 1531)  morphine 2 MG/ML injection 2 mg (2 mg Intravenous Given 08/01/14 1530)  sodium chloride 0.9 % bolus 200 mL (0 mLs Intravenous Stopped 08/01/14 1649)   Patient dropped her blood pressure after given the labetalol and the morphine for pain. Her blood pressure dropped to 122/68 patient started feeling short of breath. She was given a 200 cc bolus of fluid. Her blood pressure improved to 147/88. She states her chest pain is gone and she feels better. Daughter here now at 16:45. States they stopped her BP medication a few months ago.   Review of patient's records shows she actually was admitted in September, and at that time her doctor discussed the Norvasc 5 mg a day was controlling her blood pressure. However at that time he stopped her Maxzide. Patient will be restarted on her Norvasc 5 mg and she can followup with her doctor this week.   Labs Review Results for orders placed during the hospital encounter of 08/01/14  CBC      Result Value Ref Range   WBC 8.7  4.0 - 10.5 K/uL   RBC 3.92  3.87 - 5.11  MIL/uL   Hemoglobin 12.2  12.0 - 15.0 g/dL   HCT 37.5  36.0 - 46.0 %   MCV 95.7  78.0 - 100.0 fL   MCH 31.1  26.0 - 34.0 pg   MCHC 32.5  30.0 - 36.0 g/dL   RDW 13.8  11.5 - 15.5 %   Platelets 232  150 - 400 K/uL  BASIC METABOLIC PANEL      Result Value Ref Range   Sodium 152 (*) 137 - 147 mEq/L   Potassium 3.6 (*) 3.7 - 5.3 mEq/L   Chloride 106  96 - 112 mEq/L   CO2 35 (*) 19 - 32 mEq/L   Glucose, Bld 96  70 - 99 mg/dL   BUN  19  6 - 23 mg/dL   Creatinine, Ser 1.15 (*) 0.50 - 1.10 mg/dL   Calcium 9.4  8.4 - 10.5 mg/dL   GFR calc non Af Amer 39 (*) >90 mL/min   GFR calc Af Amer 46 (*) >90 mL/min   Anion gap 11  5 - 15  PRO B NATRIURETIC PEPTIDE      Result Value Ref Range   Pro B Natriuretic peptide (BNP) 653.0 (*) 0 - 450 pg/mL  I-STAT TROPOININ, ED      Result Value Ref Range   Troponin i, poc 0.02  0.00 - 0.08 ng/mL   Comment 3           I-STAT TROPOININ, ED      Result Value Ref Range   Troponin i, poc 0.03  0.00 - 0.08 ng/mL   Comment 3            Laboratory interpretation all normal except mild hypokalemia, mild renal insufficiency     Imaging Review Dg Chest Port 1 View  08/01/2014   CLINICAL DATA:  Chest pain.  EXAM: PORTABLE CHEST - 1 VIEW  COMPARISON:  03/09/2014.  FINDINGS: There is moderate hyperinflation with changes of COPD. Chronically elevated LEFT hemidiaphragm is stable. Normal cardiac and mediastinal silhouette. No effusion, pneumothorax, or focal areas of consolidation.  IMPRESSION: COPD.  Stable chest.   Electronically Signed   By: Rolla Flatten M.D.   On: 08/01/2014 14:10     EKG Interpretation   Date/Time:  Monday August 01 2014 13:10:55 EDT Ventricular Rate:  102 PR Interval:  115 QRS Duration: 83 QT Interval:  330 QTC Calculation: 430 R Axis:   71 Text Interpretation:  Sinus tachycardia Atrial premature complex Probable  LVH with secondary repol abnrm Since last tracing 09 Mar 2014 and 28 Sep 1999 ST depr, consider ischemia, lat leads  Baseline wander in lead(s) V2  V3 Confirmed by Merit Maybee  MD-I, Makinsey Pepitone ([id number]) on 08/01/2014 1:22:46 PM      MDM   Final diagnoses:  Essential hypertension  Atypical chest pain     New Prescriptions   AMLODIPINE (NORVASC) 5 MG TABLET    Take 1 tablet (5 mg total) by mouth daily.    Plan discharge   Rolland Porter, MD, Alanson Aly, MD 08/01/14 [phone]

## 2014-08-01 NOTE — ED Notes (Signed)
Per [facility], pt from home. Pt PMH of HTN, COPD. Pt was sitting at home, had sudden onset of substernal chest pressure, radiating to right jaw and ear. Denies cardiac hx. Pt has not been taking HTN meds. BP 200/120. Denies SOB. Nothing makes it worse, denies N/V/D, dizziness or palpitations. 12 lead unremarkable. Pt is AAOX4. Pt somewhat wheezy throughout lungs, given albuterol treatment by EMS with some relief. Pt given 324 asa and 1 nitro by EMS, pt states pressure nearly gone with 1 nitro.

## 2014-08-02 ENCOUNTER — Encounter (HOSPITAL_COMMUNITY): Payer: Self-pay | Admitting: Emergency Medicine

## 2014-08-02 ENCOUNTER — Emergency Department (HOSPITAL_COMMUNITY): Payer: Medicare Other

## 2014-08-02 ENCOUNTER — Inpatient Hospital Stay (HOSPITAL_COMMUNITY)
Admission: EM | Admit: 2014-08-02 | Discharge: 2014-08-16 | DRG: 299 | Disposition: E | Payer: Medicare Other | Attending: Internal Medicine | Admitting: Internal Medicine

## 2014-08-02 DIAGNOSIS — I712 Thoracic aortic aneurysm, without rupture, unspecified: Secondary | ICD-10-CM | POA: Diagnosis present

## 2014-08-02 DIAGNOSIS — I129 Hypertensive chronic kidney disease with stage 1 through stage 4 chronic kidney disease, or unspecified chronic kidney disease: Secondary | ICD-10-CM | POA: Diagnosis present

## 2014-08-02 DIAGNOSIS — N179 Acute kidney failure, unspecified: Secondary | ICD-10-CM | POA: Diagnosis present

## 2014-08-02 DIAGNOSIS — Z853 Personal history of malignant neoplasm of breast: Secondary | ICD-10-CM | POA: Diagnosis not present

## 2014-08-02 DIAGNOSIS — E872 Acidosis, unspecified: Secondary | ICD-10-CM | POA: Diagnosis present

## 2014-08-02 DIAGNOSIS — I959 Hypotension, unspecified: Secondary | ICD-10-CM | POA: Diagnosis present

## 2014-08-02 DIAGNOSIS — J449 Chronic obstructive pulmonary disease, unspecified: Secondary | ICD-10-CM | POA: Diagnosis present

## 2014-08-02 DIAGNOSIS — Z888 Allergy status to other drugs, medicaments and biological substances status: Secondary | ICD-10-CM | POA: Diagnosis not present

## 2014-08-02 DIAGNOSIS — R5381 Other malaise: Secondary | ICD-10-CM | POA: Diagnosis present

## 2014-08-02 DIAGNOSIS — E43 Unspecified severe protein-calorie malnutrition: Secondary | ICD-10-CM | POA: Diagnosis present

## 2014-08-02 DIAGNOSIS — Z515 Encounter for palliative care: Secondary | ICD-10-CM | POA: Diagnosis not present

## 2014-08-02 DIAGNOSIS — N189 Chronic kidney disease, unspecified: Secondary | ICD-10-CM | POA: Diagnosis present

## 2014-08-02 DIAGNOSIS — Z833 Family history of diabetes mellitus: Secondary | ICD-10-CM | POA: Diagnosis not present

## 2014-08-02 DIAGNOSIS — I7101 Dissection of thoracic aorta: Secondary | ICD-10-CM

## 2014-08-02 DIAGNOSIS — I711 Thoracic aortic aneurysm, ruptured, unspecified: Principal | ICD-10-CM | POA: Diagnosis present

## 2014-08-02 DIAGNOSIS — R0789 Other chest pain: Secondary | ICD-10-CM

## 2014-08-02 DIAGNOSIS — I7121 Aneurysm of the ascending aorta, without rupture: Secondary | ICD-10-CM

## 2014-08-02 DIAGNOSIS — R4182 Altered mental status, unspecified: Secondary | ICD-10-CM | POA: Diagnosis present

## 2014-08-02 DIAGNOSIS — Z66 Do not resuscitate: Secondary | ICD-10-CM | POA: Diagnosis present

## 2014-08-02 DIAGNOSIS — J45909 Unspecified asthma, uncomplicated: Secondary | ICD-10-CM | POA: Diagnosis present

## 2014-08-02 DIAGNOSIS — I312 Hemopericardium, not elsewhere classified: Secondary | ICD-10-CM | POA: Diagnosis present

## 2014-08-02 DIAGNOSIS — I718 Aortic aneurysm of unspecified site, ruptured: Secondary | ICD-10-CM | POA: Diagnosis present

## 2014-08-02 DIAGNOSIS — I10 Essential (primary) hypertension: Secondary | ICD-10-CM | POA: Diagnosis present

## 2014-08-02 DIAGNOSIS — I71019 Dissection of thoracic aorta, unspecified: Secondary | ICD-10-CM

## 2014-08-02 DIAGNOSIS — R079 Chest pain, unspecified: Secondary | ICD-10-CM | POA: Diagnosis not present

## 2014-08-02 DIAGNOSIS — J4489 Other specified chronic obstructive pulmonary disease: Secondary | ICD-10-CM | POA: Diagnosis present

## 2014-08-02 LAB — CBC WITH DIFFERENTIAL/PLATELET
BASOS PCT: 0 % (ref 0–1)
Basophils Absolute: 0 10*3/uL (ref 0.0–0.1)
Eosinophils Absolute: 0 10*3/uL (ref 0.0–0.7)
Eosinophils Relative: 0 % (ref 0–5)
HEMATOCRIT: 27.5 % — AB (ref 36.0–46.0)
HEMOGLOBIN: 9.3 g/dL — AB (ref 12.0–15.0)
Lymphocytes Relative: 11 % — ABNORMAL LOW (ref 12–46)
Lymphs Abs: 1 10*3/uL (ref 0.7–4.0)
MCH: 31.5 pg (ref 26.0–34.0)
MCHC: 33.1 g/dL (ref 30.0–36.0)
MCV: 95.2 fL (ref 78.0–100.0)
Monocytes Absolute: 0.6 10*3/uL (ref 0.1–1.0)
Monocytes Relative: 6 % (ref 3–12)
NEUTROS ABS: 7.5 10*3/uL (ref 1.7–7.7)
Neutrophils Relative %: 82 % — ABNORMAL HIGH (ref 43–77)
Platelets: 189 10*3/uL (ref 150–400)
RBC: 2.89 MIL/uL — ABNORMAL LOW (ref 3.87–5.11)
RDW: 14 % (ref 11.5–15.5)
WBC: 9.2 10*3/uL (ref 4.0–10.5)

## 2014-08-02 LAB — COMPREHENSIVE METABOLIC PANEL
ALK PHOS: 46 U/L (ref 39–117)
ALT: 17 U/L (ref 0–35)
AST: 29 U/L (ref 0–37)
Albumin: 2.2 g/dL — ABNORMAL LOW (ref 3.5–5.2)
Anion gap: 11 (ref 5–15)
BUN: 26 mg/dL — ABNORMAL HIGH (ref 6–23)
CALCIUM: 7.6 mg/dL — AB (ref 8.4–10.5)
CO2: 26 meq/L (ref 19–32)
Chloride: 111 mEq/L (ref 96–112)
Creatinine, Ser: 1.61 mg/dL — ABNORMAL HIGH (ref 0.50–1.10)
GFR calc non Af Amer: 26 mL/min — ABNORMAL LOW (ref >90)
GFR, EST AFRICAN AMERICAN: 30 mL/min — AB (ref >90)
Glucose, Bld: 188 mg/dL — ABNORMAL HIGH (ref 70–99)
Potassium: 3.3 mEq/L — ABNORMAL LOW (ref 3.7–5.3)
Sodium: 148 mEq/L — ABNORMAL HIGH (ref 137–147)
Total Bilirubin: 0.3 mg/dL (ref 0.3–1.2)
Total Protein: 4.4 g/dL — ABNORMAL LOW (ref 6.0–8.3)

## 2014-08-02 LAB — D-DIMER, QUANTITATIVE: D-Dimer, Quant: 2.29 ug/mL-FEU — ABNORMAL HIGH (ref 0.00–0.48)

## 2014-08-02 LAB — I-STAT TROPONIN, ED: Troponin i, poc: 0.04 ng/mL (ref 0.00–0.08)

## 2014-08-02 LAB — LIPASE, BLOOD: Lipase: 25 U/L (ref 11–59)

## 2014-08-02 LAB — I-STAT CG4 LACTIC ACID, ED: LACTIC ACID, VENOUS: 3.58 mmol/L — AB (ref 0.5–2.2)

## 2014-08-02 MED ORDER — FENTANYL CITRATE 0.05 MG/ML IJ SOLN
25.0000 ug | INTRAMUSCULAR | Status: DC | PRN
  Administered 2014-08-02 (×2): 50 ug via INTRAVENOUS
  Filled 2014-08-02 (×3): qty 2

## 2014-08-02 MED ORDER — ALBUTEROL SULFATE HFA 108 (90 BASE) MCG/ACT IN AERS: 2.0000 | INHALATION_SPRAY | RESPIRATORY_TRACT | Status: DC | PRN

## 2014-08-02 MED ORDER — ONDANSETRON HCL 4 MG/2ML IJ SOLN
4.0000 mg | Freq: Once | INTRAMUSCULAR | Status: AC
  Administered 2014-08-02: 4 mg via INTRAVENOUS
  Filled 2014-08-02: qty 2

## 2014-08-02 MED ORDER — FENTANYL CITRATE 0.05 MG/ML IJ SOLN
50.0000 ug | Freq: Once | INTRAMUSCULAR | Status: AC
  Administered 2014-08-02: 50 ug via INTRAVENOUS
  Filled 2014-08-02: qty 2

## 2014-08-02 MED ORDER — ACETAMINOPHEN 325 MG PO TABS: 650.0000 mg | ORAL_TABLET | Freq: Four times a day (QID) | ORAL | Status: DC | PRN

## 2014-08-02 MED ORDER — ALBUTEROL SULFATE (2.5 MG/3ML) 0.083% IN NEBU
2.5000 mg | INHALATION_SOLUTION | RESPIRATORY_TRACT | Status: DC | PRN
  Administered 2014-08-03: 2.5 mg via RESPIRATORY_TRACT
  Filled 2014-08-02: qty 3

## 2014-08-02 MED ORDER — SODIUM CHLORIDE 0.9 % IV BOLUS (SEPSIS)
1000.0000 mL | Freq: Once | INTRAVENOUS | Status: AC
  Administered 2014-08-02: 1000 mL via INTRAVENOUS

## 2014-08-02 MED ORDER — OXYBUTYNIN CHLORIDE 5 MG PO TABS: 2.5000 mg | ORAL_TABLET | Freq: Three times a day (TID) | ORAL | Status: DC | PRN

## 2014-08-02 MED ORDER — SODIUM CHLORIDE 0.9 % IV SOLN
Freq: Once | INTRAVENOUS | Status: AC
  Administered 2014-08-02: 1000 mL via INTRAVENOUS

## 2014-08-02 MED ORDER — ONDANSETRON HCL 4 MG/2ML IJ SOLN: 4.0000 mg | Freq: Four times a day (QID) | INTRAMUSCULAR | Status: DC | PRN

## 2014-08-02 MED ORDER — ACETAMINOPHEN 650 MG RE SUPP: 650.0000 mg | Freq: Four times a day (QID) | RECTAL | Status: DC | PRN

## 2014-08-02 MED ORDER — LORAZEPAM 2 MG/ML IJ SOLN
1.0000 mg | INTRAMUSCULAR | Status: DC | PRN
  Administered 2014-08-02: 1 mg via INTRAVENOUS
  Filled 2014-08-02: qty 1

## 2014-08-02 MED ORDER — ACETAMINOPHEN 500 MG PO TABS: 500.0000 mg | ORAL_TABLET | Freq: Four times a day (QID) | ORAL | Status: DC | PRN

## 2014-08-02 NOTE — ED Notes (Signed)
Hospitalist at bedside 

## 2014-08-02 NOTE — ED Provider Notes (Signed)
CSN: [id number]     Arrival date & time 07/17/2014  0354 History   First MD Initiated Contact with Patient 07/29/2014 [phone]     Chief Complaint  Patient presents with  . Chest Pain  . Shortness of Breath     (Consider location/radiation/quality/duration/timing/severity/associated sxs/prior Treatment) HPI 80 yo female presents to the ER via EMS from home with complaint of chest pain, shortness of breath.  Per EMS report was seen yesterday for same, noted to have htn, treated for COPD exacerbation.  Pt given tylenol and prednisone for management.  Pt received albuterol by daughter, duoneb by ems.  Pt c/o central chest pain radiating into jaw bilaterally and pain under right shoulder blade.  She complains she cannot get enough air. Pt hypotensive, somnolent, moaning upon arrival, has improved slightly with time to give some history.  No family available to assess her baseline.  Prior notes reviewed, no mention of COPD, pt received labetalol, morphine, and was started on amlodipine.   Past Medical History  Diagnosis Date  . Asthma   . Hypertension   . COPD (chronic obstructive pulmonary disease)   . Breast CA dx'd 1999    left  . Chronic kidney disease   . Chronic edema   . Breast cancer, left breast   . Overactive bladder   . Chronic gastritis   . Abdominal pain   . Herpes zoster   . Dysphagia   . Abnormal findings on esophagogastroduodenoscopy (EGD)    Past Surgical History  Procedure Laterality Date  . Vaginal hysterectomy     Family History  Problem Relation Age of Onset  . Diabetes Sister   . Diabetes Sister    History  Substance Use Topics  . Smoking status: Never Smoker   . Smokeless tobacco: Never Used  . Alcohol Use: No   OB History   Grav Para Term Preterm Abortions TAB SAB Ect Mult Living                 Review of Systems  Unable to perform ROS: Acuity of condition      Allergies  Tramadol  Home Medications   Prior to Admission medications   Medication  Sig Start Date End Date Taking? Authorizing Provider  acetaminophen (TYLENOL) 500 MG tablet Take 500-1,000 mg by mouth every 6 (six) hours as needed for mild pain.   Yes Historical Provider, MD  albuterol (PROAIR HFA) 108 (90 BASE) MCG/ACT inhaler Inhale 2 puffs into the lungs every 4 (four) hours as needed for wheezing or shortness of breath.    Yes Historical Provider, MD  amLODipine (NORVASC) 5 MG tablet Take 1 tablet (5 mg total) by mouth daily. 08/01/14  Yes Janice Norrie, MD  ferrous sulfate 325 (65 FE) MG tablet Take 325 mg by mouth every other day.  01/31/13  Yes Kalman Drape, MD  Fluticasone-Salmeterol (ADVAIR) 100-50 MCG/DOSE AEPB Inhale 1 puff into the lungs every 12 (twelve) hours.   Yes Historical Provider, MD  oxybutynin (DITROPAN) 5 MG tablet Take 2.5 mg by mouth every 8 (eight) hours as needed for bladder spasms.    Yes Historical Provider, MD   BP 91/60  Pulse 85  Temp(Src) 97.7 F (36.5 C) (Oral)  Resp 25  SpO2 97% Physical Exam  Nursing note and vitals reviewed. Constitutional: She appears distressed.  Frail elderly female in moderate distress due to pain, respiratory distress  HENT:  Head: Normocephalic and atraumatic.  Nose: Nose normal.  Dry mucous membranes  Eyes: Conjunctivae and EOM are normal. Pupils are equal, round, and reactive to light.  Neck: Normal range of motion. Neck supple. No JVD present. No tracheal deviation present. No thyromegaly present.  Cardiovascular: Regular rhythm, normal heart sounds and intact distal pulses.  Exam reveals no gallop and no friction rub.   No murmur heard. Tachycardia  Pulmonary/Chest: Effort normal and breath sounds normal. No stridor. No respiratory distress. She has no wheezes. She has no rales. She exhibits no tenderness.  Abdominal: Soft. Bowel sounds are normal. She exhibits no distension and no mass. There is no tenderness. There is no rebound and no guarding.  Musculoskeletal: Normal range of motion. She exhibits no  edema and no tenderness.  Lymphadenopathy:    She has no cervical adenopathy.  Neurological: She has normal reflexes. No cranial nerve deficit. She exhibits normal muscle tone. Coordination normal.  Somnolent but arousable  Skin: Skin is warm and dry. No rash noted. No erythema. No pallor.    ED Course  Procedures (including critical care time) Labs Review Labs Reviewed  CBC WITH DIFFERENTIAL - Abnormal; Notable for the following:    RBC 2.89 (*)    Hemoglobin 9.3 (*)    HCT 27.5 (*)    Neutrophils Relative % 82 (*)    Lymphocytes Relative 11 (*)    All other components within normal limits  COMPREHENSIVE METABOLIC PANEL - Abnormal; Notable for the following:    Sodium 148 (*)    Potassium 3.3 (*)    Glucose, Bld 188 (*)    BUN 26 (*)    Creatinine, Ser 1.61 (*)    Calcium 7.6 (*)    Total Protein 4.4 (*)    Albumin 2.2 (*)    GFR calc non Af Amer 26 (*)    GFR calc Af Amer 30 (*)    All other components within normal limits  D-DIMER, QUANTITATIVE - Abnormal; Notable for the following:    D-Dimer, Quant 2.29 (*)    All other components within normal limits  I-STAT CG4 LACTIC ACID, ED - Abnormal; Notable for the following:    Lactic Acid, Venous 3.58 (*)    All other components within normal limits  LIPASE, BLOOD  I-STAT TROPOININ, ED    Imaging Review Ct Chest Wo Contrast  07/30/2014   CLINICAL DATA:  Chest pain and shortness of breath. Concern for aortic thrombus.  EXAM: CT CHEST WITHOUT CONTRAST  TECHNIQUE: Multidetector CT imaging of the chest was performed following the standard protocol without IV contrast.  COMPARISON:  None.  FINDINGS: THORACIC INLET/BODY WALL:  No acute abnormality.  MEDIASTINUM:  Fusiform enlargement of the ascending aorta measuring up to 4.4 cm in diameter. Along the anterior wall of the fusiform aneurysm is discontinuity of the high-density wall, consistent with rupture. A pseudoaneurysm projects medially and anteriorly from the discontinuity,  measuring 12 mm in thickness by 3 cm in craniocaudal dimension. There is diffuse mediastinal hematoma, dissecting along the cava and pulmonary arteries. Small pericardial effusion, density compatible with hemopericardium.  LUNG WINDOWS:  Small right pleural effusion which is low-density. Mild right basilar atelectasis. Diffuse bronchial wall thickening.  UPPER ABDOMEN:  No acute findings.  OSSEOUS:  No acute fracture.  No suspicious lytic or blastic lesions.  Critical Value/emergent results were called by telephone at the time of interpretation on 08/04/2014 at 7:18 am to Dr. Linton Flemings , who verbally acknowledged these results.  IMPRESSION: Ruptured ascending aortic aneurysm with diffuse mediastinal hematoma and small volume hemopericardium.  Electronically Signed   By: Jorje Guild M.D.   On: 08/10/2014 07:29   Dg Chest Port 1 View  08/08/2014   CLINICAL DATA:  Shortness of breath  EXAM: PORTABLE CHEST - 1 VIEW  COMPARISON:  08/01/2014  FINDINGS: Mild cardiomegaly which is chronic. There is chronic elevation of the left diaphragm. Stable aortic contours. Apparent widening of the right peritracheal mediastinum is likely technical given the rapid change. The right lung is hyperinflated. There is no edema, consolidation, effusion, or pneumothorax.  IMPRESSION: COPD without acute superimposed findings.   Electronically Signed   By: Jorje Guild M.D.   On: 08/13/2014 06:10   Dg Chest Port 1 View  08/01/2014   CLINICAL DATA:  Chest pain.  EXAM: PORTABLE CHEST - 1 VIEW  COMPARISON:  03/09/2014.  FINDINGS: There is moderate hyperinflation with changes of COPD. Chronically elevated LEFT hemidiaphragm is stable. Normal cardiac and mediastinal silhouette. No effusion, pneumothorax, or focal areas of consolidation.  IMPRESSION: COPD.  Stable chest.   Electronically Signed   By: Rolla Flatten M.D.   On: 08/01/2014 14:10     EKG Interpretation   Date/Time:  Tuesday August 02 2014 04:43:38 EDT Ventricular Rate:   88 PR Interval:  126 QRS Duration: 75 QT Interval:  404 QTC Calculation: 489 R Axis:   75 Text Interpretation:  Sinus rhythm Probable LVH with secondary repol abnrm  Borderline prolonged QT interval Confirmed by Meyer Dockery  MD, Geanie Pacifico ([id number]) on  07/27/2014 4:55:21 AM     CRITICAL CARE Performed by: Kalman Drape Total critical care time: 90 min Critical care time was exclusive of separately billable procedures and treating other patients. Critical care was necessary to treat or prevent imminent or life-threatening deterioration. Critical care was time spent personally by me on the following activities: development of treatment plan with patient and/or surrogate as well as nursing, discussions with consultants, evaluation of patient's response to treatment, examination of patient, obtaining history from patient or surrogate, ordering and performing treatments and interventions, ordering and review of laboratory studies, ordering and review of radiographic studies, pulse oximetry and re-evaluation of patient's condition.  MDM   Final diagnoses:  Ascending aortic aneurysm  Ruptured aortic aneurysm    80 yo female with chest pain, sob, hypotension, decreased mental status.  Plan for labs, ekg, chest xray.    Istat troponin negative, lactate elevated, no source of infection noted.  Pt has responded to fluid bolus and is more responsive.  Will continue to monitor closely.  6:23 AM Chest x-ray with mild widening of right mediastinum on portable chest is dry.  Discuss with Dr. Pascal Lux who read the x-ray.  He does not see any distal aortic changes.  Given her bump in her creatinine, planned for noncontrast CT scan looking for aortic thrombus given her drop in H&H and worsening pain.  If negative, will need VQ scan to evaluate for PE.  Patient will need admission to the hospital.  7:28 AM CT chest shows ascending aortic aneurysm with rupture, some hemopericardium.  Pt with stable vitals, but is  complaining of increasing shortness of breath and chest pain.  D/w pt's daughter who is on way to hospital.  I relayed poor prognosis of patient given her age and fragility.  Will d/w CT surgery.  8:00 AM D/w Dr Roxy Manns, who does not feel patient is a surgical candidate.  He recommends palliative care.  He will speak with daughter when she arrives if needed.  Will d/w hospitalist for admission  for palliative care.  Kalman Drape, MD 08/12/2014 [phone]

## 2014-08-02 NOTE — ED Notes (Signed)
MD at bedside. Dr. Rosana Hoes with patient's daughter.

## 2014-08-02 NOTE — ED Notes (Signed)
Dr. Sharol Given called to the bedside due to patient presentation.  RR in 30's, pupil reaction unequal, and breathing with abdomen.  Nauseous on arrival.

## 2014-08-02 NOTE — ED Notes (Signed)
Per EMS, the patient was recently here for chest pain. Family reports she was given prednisone and tylenol for management.  She woke up around 3am, and daughter gave albuterol treatment with no relief.  Duoneb given by EMS. Started complaining of chest pain with EMS.  No aspirin or nitro because patient is sleepy.  No solumedrol given due to family reporting recent administration of prednisone BP 96/55, P 100. 12 lead shows LVH.  99% O2 sat.  Neb treatment still continuing on arrival.  Chest pain without palpation.

## 2014-08-02 NOTE — ED Notes (Signed)
20g placed by EMS in left forearm.

## 2014-08-02 NOTE — ED Notes (Signed)
Notified Dr.James, pt is nauseated. Waiting for new orders.

## 2014-08-02 NOTE — H&P (Signed)
[facility] History and Physical  Ebony Peterson EYC:[id number] DOB: 1918/03/30 DOA: 07/31/2014  Referring physician: EDP PCP: Irven Shelling, MD   Chief Complaint: chest pain, dyspnea  HPI: Ebony Peterson is a 80 y.o. female with past medical history of asthma, hypertension, remote history of breast cancer was seen in the ER yesterday with transient chest pain and subsequently discharged home she presented back early this morning with increasing chest pain and shortness of breath. On further evaluation and found to have a widened mediastinum on chest x-ray which prompted a CT chest which revealed a ruptured ascending aortic aneurysm with mediastinal hematoma. After discussion with thoracic surgery, EDP and patient's family, it was relayed to the patient's family that she would not be a surgical candidate and would not survive any surgical intervention and hence she is being admitted for comfort measures. Currently drowsy after receiving IV narcotics and unable to provide any further history    Review of Systems: positives bolded Constitutional:  No weight loss, night sweats, Fevers, chills, fatigue.  HEENT:  No headaches, Difficulty swallowing,Tooth/dental problems,Sore throat,  No sneezing, itching, ear ache, nasal congestion, post nasal drip,  Cardio-vascular:   chest pain, Orthopnea, PND, swelling in lower extremities, anasarca, dizziness, palpitations  GI:  No heartburn, indigestion, abdominal pain, nausea, vomiting, diarrhea, change in bowel habits, loss of appetite  Resp:  shortness of breath with exertion or at rest. No excess mucus, no productive cough, No non-productive cough, No coughing up of blood.No change in color of mucus.No wheezing.No chest wall deformity  Skin:  no rash or lesions.  GU:  no dysuria, change in color of urine, no urgency or frequency. No flank pain.  Musculoskeletal:  joint pain or swelling. No decreased range of motion. No back pain.    Psych:  No change in mood or affect. No depression or anxiety. No memory loss.   Past Medical History  Diagnosis Date  . Asthma   . Hypertension   . COPD (chronic obstructive pulmonary disease)   . Breast CA dx'd 1999    left  . Chronic kidney disease   . Chronic edema   . Breast cancer, left breast   . Overactive bladder   . Chronic gastritis   . Abdominal pain   . Herpes zoster   . Dysphagia   . Abnormal findings on esophagogastroduodenoscopy (EGD)    Past Surgical History  Procedure Laterality Date  . Vaginal hysterectomy     Social History:  reports that she has never smoked. She has never used smokeless tobacco. She reports that she does not drink alcohol or use illicit drugs.  Allergies  Allergen Reactions  . Tramadol Palpitations    Family History  Problem Relation Age of Onset  . Diabetes Sister   . Diabetes Sister      Prior to Admission medications   Medication Sig Start Date End Date Taking? Authorizing Provider  acetaminophen (TYLENOL) 500 MG tablet Take 500-1,000 mg by mouth every 6 (six) hours as needed for mild pain.   Yes Historical Provider, MD  albuterol (PROAIR HFA) 108 (90 BASE) MCG/ACT inhaler Inhale 2 puffs into the lungs every 4 (four) hours as needed for wheezing or shortness of breath.    Yes Historical Provider, MD  amLODipine (NORVASC) 5 MG tablet Take 1 tablet (5 mg total) by mouth daily. 08/01/14  Yes Janice Norrie, MD  ferrous sulfate 325 (65 FE) MG tablet Take 325 mg by mouth every other day.  01/31/13  Yes Kalman Drape, MD  Fluticasone-Salmeterol (ADVAIR) 100-50 MCG/DOSE AEPB Inhale 1 puff into the lungs every 12 (twelve) hours.   Yes Historical Provider, MD  oxybutynin (DITROPAN) 5 MG tablet Take 2.5 mg by mouth every 8 (eight) hours as needed for bladder spasms.    Yes Historical Provider, MD   Physical Exam: Filed Vitals:   08/05/2014 1138 07/20/2014 1200 07/27/2014 1230 07/22/2014 1315  BP:  93/61 113/70 114/70  Pulse:  86 92 80  Temp:     97.9 F (36.6 C)  TempSrc:    Oral  Resp:  25 20 20   SpO2: 100% 100% 100% 100%    Wt Readings from Last 3 Encounters:  09/02/13 35.8 kg (78 lb 14.8 oz)  05/24/12 46.584 kg (102 lb 11.2 oz)  01/26/12 49.896 kg (110 lb)    General:  Sleepy, arousible and mumbles a few words Eyes: pupils small but reactive, normal lids, irises & conjunctiva ENT: grossly normal lips & tongue Neck: no LAD, masses or thyromegaly Cardiovascular: RRR, no m/r/g. No LE edema. Respiratory: poor air movement  Abdomen: soft, nt, ND, BS present Skin: no rash or induration seen on limited exam Musculoskeletal: grossly normal tone BUE/BLE Psychiatric: unable to assess, drowsy after narcotics Neurologic: drowsy, sleepy, did not assess          Labs on Admission:  Basic Metabolic Panel:  Recent Labs Lab 08/01/14 1400 07/21/2014 0525  NA 152* 148*  K 3.6* 3.3*  CL 106 111  CO2 35* 26  GLUCOSE 96 188*  BUN 19 26*  CREATININE 1.15* 1.61*  CALCIUM 9.4 7.6*   Liver Function Tests:  Recent Labs Lab 08/12/2014 0525  AST 29  ALT 17  ALKPHOS 46  BILITOT 0.3  PROT 4.4*  ALBUMIN 2.2*    Recent Labs Lab 07/25/2014 0525  LIPASE 25   No results found for this basename: AMMONIA,  in the last 168 hours CBC:  Recent Labs Lab 08/01/14 1400 07/23/2014 0525  WBC 8.7 9.2  NEUTROABS  --  7.5  HGB 12.2 9.3*  HCT 37.5 27.5*  MCV 95.7 95.2  PLT 232 189   Cardiac Enzymes: No results found for this basename: CKTOTAL, CKMB, CKMBINDEX, TROPONINI,  in the last 168 hours  BNP (last 3 results)  Recent Labs  08/01/14 1400  PROBNP 653.0*   CBG: No results found for this basename: GLUCAP,  in the last 168 hours  Radiological Exams on Admission: Ct Chest Wo Contrast  07/26/2014   CLINICAL DATA:  Chest pain and shortness of breath. Concern for aortic thrombus.  EXAM: CT CHEST WITHOUT CONTRAST  TECHNIQUE: Multidetector CT imaging of the chest was performed following the standard protocol without IV  contrast.  COMPARISON:  None.  FINDINGS: THORACIC INLET/BODY WALL:  No acute abnormality.  MEDIASTINUM:  Fusiform enlargement of the ascending aorta measuring up to 4.4 cm in diameter. Along the anterior wall of the fusiform aneurysm is discontinuity of the high-density wall, consistent with rupture. A pseudoaneurysm projects medially and anteriorly from the discontinuity, measuring 12 mm in thickness by 3 cm in craniocaudal dimension. There is diffuse mediastinal hematoma, dissecting along the cava and pulmonary arteries. Small pericardial effusion, density compatible with hemopericardium.  LUNG WINDOWS:  Small right pleural effusion which is low-density. Mild right basilar atelectasis. Diffuse bronchial wall thickening.  UPPER ABDOMEN:  No acute findings.  OSSEOUS:  No acute fracture.  No suspicious lytic or blastic lesions.  Critical Value/emergent results were called by telephone at the  time of interpretation on 08/01/2014 at 7:18 am to Dr. Linton Flemings , who verbally acknowledged these results.  IMPRESSION: Ruptured ascending aortic aneurysm with diffuse mediastinal hematoma and small volume hemopericardium.   Electronically Signed   By: Jorje Guild M.D.   On: 07/30/2014 07:29   Dg Chest Port 1 View  08/09/2014   CLINICAL DATA:  Shortness of breath  EXAM: PORTABLE CHEST - 1 VIEW  COMPARISON:  08/01/2014  FINDINGS: Mild cardiomegaly which is chronic. There is chronic elevation of the left diaphragm. Stable aortic contours. Apparent widening of the right peritracheal mediastinum is likely technical given the rapid change. The right lung is hyperinflated. There is no edema, consolidation, effusion, or pneumothorax.  IMPRESSION: COPD without acute superimposed findings.   Electronically Signed   By: Jorje Guild M.D.   On: 07/26/2014 06:10   Dg Chest Port 1 View  08/01/2014   CLINICAL DATA:  Chest pain.  EXAM: PORTABLE CHEST - 1 VIEW  COMPARISON:  03/09/2014.  FINDINGS: There is moderate hyperinflation  with changes of COPD. Chronically elevated LEFT hemidiaphragm is stable. Normal cardiac and mediastinal silhouette. No effusion, pneumothorax, or focal areas of consolidation.  IMPRESSION: COPD.  Stable chest.   Electronically Signed   By: Rolla Flatten M.D.   On: 08/01/2014 14:10     Assessment/Plan  1. Ruptured ascending aortic aneurysm  -with diffuse mediastinal hematoma and small volume hemopericardium. -not a surgical candidate per Dr.Owen CVTS -Admit for comfort measures, pain control, ativan PRN -family aware of likely demise soon -DNR  -if survives for 48hours could consider residential hospice   2. Protein-calorie malnutrition, severe  3. AKI (acute kidney injury)  4. HTN (hypertension)-hold BP meds  5. Asthma  -all the subsequent issues # 2-5,  irrelevant in the setting of #1  Code Status: DNR DVT Prophylaxis: none Family Communication: d/w daughter and grandson at bedside Disposition Plan: expect hospital demise  Time spent: 75min  [facility] Pager [phone]  **Disclaimer: This note may have been dictated with voice recognition software. Similar sounding words can inadvertently be transcribed and this note may contain transcription errors which may not have been corrected upon publication of note.**

## 2014-08-02 NOTE — ED Notes (Signed)
Pt with emesis. Dr. Sharol Given made aware.

## 2014-08-02 NOTE — ED Notes (Signed)
Reported BP of 85/55 and heart rate 84 to Dr. Sharol Given. MD gives verbal order for bolus infusion.

## 2014-08-02 NOTE — ED Provider Notes (Addendum)
Care assumed from Dr. Sharol Given.  I've fully aware of the patient's diagnosis, and current condition, situation. The patient's daughter, Marcie Bal, has arrived. I reviewed the patient's findings with her. Patient's daughter does have power of attorney. I have discussed with her, and recommended that the patient be made DO NOT RESUSCITATE.  She is discussing this further with some additional family members via phone. I placed a call to Dr. Roxy Manns of  CT surgery. I relayed it to him via the [place] at the patient's family has arrived. Have asked him to meet and talk with them and his first availability after completion of his current OR case.  Case discussed with hospitalist. Plan is admission to a general medical bed for comfort measures.  Daughter, Marcie Bal has had a discussion with all of her family members. She is comfortable cosigning a DO NOT RESUSCITATE order.  Tanna Furry, MD 07/19/2014 [place], MD 07/18/2014 [phone]

## 2014-08-02 NOTE — Consult Note (Addendum)
[address]       [place] [address]             [phone]          CARDIOTHORACIC SURGERY CONSULTATION REPORT  PCP is Irven Shelling, MD Referring Provider is OTTER, Sharlett Iles, MD and Tanna Furry, MD   Reason for consultation:  Aortic Dissection  HPI:  Patient is a 80 year old widowed African American female with multiple medical problems including long-standing hypertension, chronic shortness of breath attributed to chronic obstructive pulmonary disease, asthma, chronic bronchitis, chronic gastritis, chronic abdominal pain, and chronic kidney disease who presented to the emergency department yesterday with acute exacerbation of chronic shortness of breath, chest pain radiating to the jaw, and severe hypertension. She was treated medically for hypertension and COPD exacerbation and sent home. She was brought back to the emergency department via EMS early this morning with persistence of the same symptoms. She was noted to be hypotensive, somnolent and lethargic. Chest CT scan performed without contrast demonstrates acute type A aortic dissection. Cardiothoracic surgical consultation was requested.  The patient is widowed and lives locally and [place] with her daughter and several other family members. She has been very debilitated from a physical standpoint for years. She she is severely limited by long-standing chronic exertional shortness of breath and generalized weakness. She is unable to get or so from the bed to wheelchair without assistance. She occasionally can get herself to a bedside commode without assistance but for the most part this requires physical assistance. At present she is somnolent and unable to recognize any of her family members who are at the bedside. The patient's daughter states that the patient has had severe exertional shortness of breath for years with intermittent episodes of exertional pain in her jaw and upper chest. This has become  acutely worse over the last several days.  Past Medical History  Diagnosis Date  . Asthma   . Hypertension   . COPD (chronic obstructive pulmonary disease)   . Breast CA dx'd 1999    left  . Chronic kidney disease   . Chronic edema   . Breast cancer, left breast   . Overactive bladder   . Chronic gastritis   . Abdominal pain   . Herpes zoster   . Dysphagia   . Abnormal findings on esophagogastroduodenoscopy (EGD)     Past Surgical History  Procedure Laterality Date  . Vaginal hysterectomy      Family History  Problem Relation Age of Onset  . Diabetes Sister   . Diabetes Sister     History   Social History  . Marital Status: Widowed    Spouse Name: N/A    Number of Children: N/A  . Years of Education: N/A   Occupational History  . nurse     Retired   Social History Main Topics  . Smoking status: Never Smoker   . Smokeless tobacco: Never Used  . Alcohol Use: No  . Drug Use: No  . Sexual Activity: No   Other Topics Concern  . Not on file   Social History Narrative  . No narrative on file    Prior to Admission medications   Medication Sig Start Date End Date Taking? Authorizing Provider  acetaminophen (TYLENOL) 500 MG tablet Take 500-1,000 mg by mouth every 6 (six) hours as needed for mild pain.   Yes Historical Provider, MD  albuterol (PROAIR HFA) 108 (90 BASE) MCG/ACT inhaler Inhale 2 puffs  into the lungs every 4 (four) hours as needed for wheezing or shortness of breath.    Yes Historical Provider, MD  amLODipine (NORVASC) 5 MG tablet Take 1 tablet (5 mg total) by mouth daily. 08/01/14  Yes Janice Norrie, MD  ferrous sulfate 325 (65 FE) MG tablet Take 325 mg by mouth every other day.  01/31/13  Yes Kalman Drape, MD  Fluticasone-Salmeterol (ADVAIR) 100-50 MCG/DOSE AEPB Inhale 1 puff into the lungs every 12 (twelve) hours.   Yes Historical Provider, MD  oxybutynin (DITROPAN) 5 MG tablet Take 2.5 mg by mouth every 8 (eight) hours as needed for bladder spasms.     Yes Historical Provider, MD    No current facility-administered medications for this encounter.   Current Outpatient Prescriptions  Medication Sig Dispense Refill  . acetaminophen (TYLENOL) 500 MG tablet Take 500-1,000 mg by mouth every 6 (six) hours as needed for mild pain.      Marland Kitchen albuterol (PROAIR HFA) 108 (90 BASE) MCG/ACT inhaler Inhale 2 puffs into the lungs every 4 (four) hours as needed for wheezing or shortness of breath.       Marland Kitchen amLODipine (NORVASC) 5 MG tablet Take 1 tablet (5 mg total) by mouth daily.  30 tablet  0  . ferrous sulfate 325 (65 FE) MG tablet Take 325 mg by mouth every other day.       . Fluticasone-Salmeterol (ADVAIR) 100-50 MCG/DOSE AEPB Inhale 1 puff into the lungs every 12 (twelve) hours.      Marland Kitchen oxybutynin (DITROPAN) 5 MG tablet Take 2.5 mg by mouth every 8 (eight) hours as needed for bladder spasms.         Allergies  Allergen Reactions  . Tramadol Palpitations      Review of Systems:   General:  poor appetite, no energy, + weight gain, + weight loss, no fever  Cardiac:  + chest pain with exertion, + chest pain at rest, + SOB with trivial exertion, + resting SOB, no PND, no orthopnea, no palpitations, no arrhythmia, no atrial fibrillation, + LE edema, no dizzy spells, no syncope  Respiratory:  + shortness of breath, no home oxygen, no productive cough, + dry cough, + bronchitis, + wheezing, no hemoptysis, + asthma, no pain with inspiration or cough, no sleep apnea, no CPAP at night  GI:   + difficulty swallowing, no reflux, no frequent heartburn, no hiatal hernia, + abdominal pain, + constipation, no diarrhea, no hematochezia, no hematemesis, no melena  GU:   no dysuria,  no frequency, no urinary tract infection, no hematuria, no kidney stones, + chronic kidney disease  Vascular:  no pain suggestive of claudication, no pain in feet, no leg cramps, no varicose veins, no DVT, no non-healing foot ulcer  Neuro:   no stroke, no TIA's, no seizures, no headaches,  no temporary blindness one eye,  no slurred speech, no peripheral neuropathy, no chronic pain, + instability of gait, + severe memory/cognitive dysfunction  Musculoskeletal: + arthritis, + joint swelling, no myalgias, + severe difficulty walking, extremely limited mobility   Skin:   + rash, no itching, no skin infections, no pressure sores or ulcerations  Psych:   no anxiety, no depression, no nervousness, no unusual recent stress  Eyes:   + blurry vision, no floaters, no recent vision changes, does not wear glasses or contacts  ENT:   + hearing loss  Hematologic:  + easy bruising, no abnormal bleeding, no clotting disorder, no frequent epistaxis  Endocrine:  no  diabetes, does not check CBG's at home     Physical Exam:   BP 87/48  Pulse 84  Temp(Src) 97.8 F (36.6 C) (Oral)  Resp 18  SpO2 100%  General:  Chronically ill and cachectic-appearing  HEENT:  Unremarkable   Neck:   no JVD, no bruits, no adenopathy   Chest:   clear to auscultation, diminished breath sounds, no wheezes, no rhonchi   CV:   tachycardic, no murmur   Abdomen:  soft, non-tender, no masses   Extremities:  Cool but adequately perfused, pulses not palpable, + mild lower extremity edema  Rectal/GU  Deferred  Neuro:   Grossly non-focal and symmetrical throughout  Skin:   Clean and dry, no rashes, no breakdown  Diagnostic Tests:  CT CHEST WITHOUT CONTRAST  TECHNIQUE:  Multidetector CT imaging of the chest was performed following the  standard protocol without IV contrast.  COMPARISON: None.  FINDINGS:  THORACIC INLET/BODY WALL:  No acute abnormality.  MEDIASTINUM:  Fusiform enlargement of the ascending aorta measuring up to 4.4 cm  in diameter. Along the anterior wall of the fusiform aneurysm is  discontinuity of the high-density wall, consistent with rupture. A  pseudoaneurysm projects medially and anteriorly from the  discontinuity, measuring 12 mm in thickness by 3 cm in craniocaudal  dimension. There is  diffuse mediastinal hematoma, dissecting along  the cava and pulmonary arteries. Small pericardial effusion, density  compatible with hemopericardium.  LUNG WINDOWS:  Small right pleural effusion which is low-density. Mild right  basilar atelectasis. Diffuse bronchial wall thickening.  UPPER ABDOMEN:  No acute findings.  OSSEOUS:  No acute fracture. No suspicious lytic or blastic lesions.  Critical Value/emergent results were called by telephone at the time  of interpretation on 07/21/2014 at 7:18 am to Dr. Linton Flemings , who  verbally acknowledged these results.  IMPRESSION:  Ruptured ascending aortic aneurysm with diffuse mediastinal hematoma  and small volume hemopericardium.  Electronically Signed  By: Jorje Guild M.D.  On: 08/13/2014 07:29     Impression:  Patient has contained rupture of type A aortic dissection which presumably is acute and currently is associated with hypotension and lactic acidosis.  This extremely elderly and debilitated patient should under no circumstances be considered candidate for surgical intervention.     Plan:  I recommend admission to the medical service and palliative care consultation. Although unlikely, it is possible that the patient might stabilize temporarily with medical therapy.  If she does stabilize then arrangements could be made for placement into hospice care.   I spent in excess of 30 minutes at the bedside with the patient and her family discussing her clinical condition and prognosis. All of their questions been addressed. Please call if we can be of further assistance.   I spent in excess of 60 minutes during the conduct of this hospital consultation and >50% of this time involved direct face-to-face encounter for counseling and/or coordination of the patient's care.   Valentina Gu. Roxy Manns, MD 08/06/2014 10:46 AM

## 2014-08-02 NOTE — ED Notes (Signed)
Notified family on pt admittance to floor.

## 2014-08-16 NOTE — Discharge Summary (Signed)
Physician Discharge Summary  Ebony Peterson MRN: [id number] DOB/AGE: 02-05-1918 80 y.o.  PCP: Irven Shelling, MD   Admit date: 08/08/2014 Discharge date: 08/21/2014  Discharge Diagnoses:  Death due to ruptured aortic aneurysm   Protein-calorie malnutrition, severe   Thoracic aortic aneurysm   AKI (acute kidney injury)   HTN (hypertension)   Asthma   Aneurysm, aortic, ruptured     Medication List    ASK your doctor about these medications       acetaminophen 500 MG tablet  Commonly known as:  TYLENOL  Take 500-1,000 mg by mouth every 6 (six) hours as needed for mild pain.     amLODipine 5 MG tablet  Commonly known as:  NORVASC  Take 1 tablet (5 mg total) by mouth daily.     ferrous sulfate 325 (65 FE) MG tablet  Take 325 mg by mouth every other day.     Fluticasone-Salmeterol 100-50 MCG/DOSE Aepb  Commonly known as:  ADVAIR  Inhale 1 puff into the lungs every 12 (twelve) hours.     oxybutynin 5 MG tablet  Commonly known as:  DITROPAN  Take 2.5 mg by mouth every 8 (eight) hours as needed for bladder spasms.     PROAIR HFA 108 (90 BASE) MCG/ACT inhaler  Generic drug:  albuterol  Inhale 2 puffs into the lungs every 4 (four) hours as needed for wheezing or shortness of breath.        Discharge Condition deceased Disposition: 01-Home or Self Care   Consults: None  Significant Diagnostic Studies: Ct Chest Wo Contrast  07/28/2014   CLINICAL DATA:  Chest pain and shortness of breath. Concern for aortic thrombus.  EXAM: CT CHEST WITHOUT CONTRAST  TECHNIQUE: Multidetector CT imaging of the chest was performed following the standard protocol without IV contrast.  COMPARISON:  None.  FINDINGS: THORACIC INLET/BODY WALL:  No acute abnormality.  MEDIASTINUM:  Fusiform enlargement of the ascending aorta measuring up to 4.4 cm in diameter. Along the anterior wall of the fusiform aneurysm is discontinuity of the high-density wall, consistent with rupture. A  pseudoaneurysm projects medially and anteriorly from the discontinuity, measuring 12 mm in thickness by 3 cm in craniocaudal dimension. There is diffuse mediastinal hematoma, dissecting along the cava and pulmonary arteries. Small pericardial effusion, density compatible with hemopericardium.  LUNG WINDOWS:  Small right pleural effusion which is low-density. Mild right basilar atelectasis. Diffuse bronchial wall thickening.  UPPER ABDOMEN:  No acute findings.  OSSEOUS:  No acute fracture.  No suspicious lytic or blastic lesions.  Critical Value/emergent results were called by telephone at the time of interpretation on 07/28/2014 at 7:18 am to Dr. Linton Flemings , who verbally acknowledged these results.  IMPRESSION: Ruptured ascending aortic aneurysm with diffuse mediastinal hematoma and small volume hemopericardium.   Electronically Signed   By: Jorje Guild M.D.   On: 08/04/2014 07:29   Dg Chest Port 1 View  08/04/2014   CLINICAL DATA:  Shortness of breath  EXAM: PORTABLE CHEST - 1 VIEW  COMPARISON:  08/01/2014  FINDINGS: Mild cardiomegaly which is chronic. There is chronic elevation of the left diaphragm. Stable aortic contours. Apparent widening of the right peritracheal mediastinum is likely technical given the rapid change. The right lung is hyperinflated. There is no edema, consolidation, effusion, or pneumothorax.  IMPRESSION: COPD without acute superimposed findings.   Electronically Signed   By: Jorje Guild M.D.   On: 07/21/2014 06:10   Dg Chest Port 1 View  08/01/2014  CLINICAL DATA:  Chest pain.  EXAM: PORTABLE CHEST - 1 VIEW  COMPARISON:  03/09/2014.  FINDINGS: There is moderate hyperinflation with changes of COPD. Chronically elevated LEFT hemidiaphragm is stable. Normal cardiac and mediastinal silhouette. No effusion, pneumothorax, or focal areas of consolidation.  IMPRESSION: COPD.  Stable chest.   Electronically Signed   By: Rolla Flatten M.D.   On: 08/01/2014 14:10        Microbiology: No results found for this or any previous visit (from the past 240 hour(s)).   Labs: Results for orders placed during the hospital encounter of 07/20/2014 (from the past 48 hour(s))  I-STAT TROPOININ, ED     Status: None   Collection Time    07/19/2014  4:53 AM      Result Value Ref Range   Troponin i, poc 0.04  0.00 - 0.08 ng/mL   Comment 3            Comment: Due to the release kinetics of cTnI,     a negative result within the first hours     of the onset of symptoms does not rule out     myocardial infarction with certainty.     If myocardial infarction is still suspected,     repeat the test at appropriate intervals.  I-STAT CG4 LACTIC ACID, ED     Status: Abnormal   Collection Time    08/01/2014  4:55 AM      Result Value Ref Range   Lactic Acid, Venous 3.58 (*) 0.5 - 2.2 mmol/L  CBC WITH DIFFERENTIAL     Status: Abnormal   Collection Time    07/20/2014  5:25 AM      Result Value Ref Range   WBC 9.2  4.0 - 10.5 K/uL   RBC 2.89 (*) 3.87 - 5.11 MIL/uL   Hemoglobin 9.3 (*) 12.0 - 15.0 g/dL   Comment: DELTA CHECK NOTED     REPEATED TO VERIFY   HCT 27.5 (*) 36.0 - 46.0 %   MCV 95.2  78.0 - 100.0 fL   MCH 31.5  26.0 - 34.0 pg   MCHC 33.1  30.0 - 36.0 g/dL   RDW 14.0  11.5 - 15.5 %   Platelets 189  150 - 400 K/uL   Neutrophils Relative % 82 (*) 43 - 77 %   Neutro Abs 7.5  1.7 - 7.7 K/uL   Lymphocytes Relative 11 (*) 12 - 46 %   Lymphs Abs 1.0  0.7 - 4.0 K/uL   Monocytes Relative 6  3 - 12 %   Monocytes Absolute 0.6  0.1 - 1.0 K/uL   Eosinophils Relative 0  0 - 5 %   Eosinophils Absolute 0.0  0.0 - 0.7 K/uL   Basophils Relative 0  0 - 1 %   Basophils Absolute 0.0  0.0 - 0.1 K/uL  COMPREHENSIVE METABOLIC PANEL     Status: Abnormal   Collection Time    08/12/2014  5:25 AM      Result Value Ref Range   Sodium 148 (*) 137 - 147 mEq/L   Potassium 3.3 (*) 3.7 - 5.3 mEq/L   Chloride 111  96 - 112 mEq/L   CO2 26  19 - 32 mEq/L   Glucose, Bld 188 (*) 70 -  99 mg/dL   BUN 26 (*) 6 - 23 mg/dL   Creatinine, Ser 1.61 (*) 0.50 - 1.10 mg/dL   Calcium 7.6 (*) 8.4 - 10.5 mg/dL   Total Protein  4.4 (*) 6.0 - 8.3 g/dL   Albumin 2.2 (*) 3.5 - 5.2 g/dL   AST 29  0 - 37 U/L   ALT 17  0 - 35 U/L   Alkaline Phosphatase 46  39 - 117 U/L   Total Bilirubin 0.3  0.3 - 1.2 mg/dL   GFR calc non Af Amer 26 (*) >90 mL/min   GFR calc Af Amer 30 (*) >90 mL/min   Comment: (NOTE)     The eGFR has been calculated using the CKD EPI equation.     This calculation has not been validated in all clinical situations.     eGFR's persistently <90 mL/min signify possible Chronic Kidney     Disease.   Anion gap 11  5 - 15  LIPASE, BLOOD     Status: None   Collection Time    08/05/2014  5:25 AM      Result Value Ref Range   Lipase 25  11 - 59 U/L  D-DIMER, QUANTITATIVE     Status: Abnormal   Collection Time    07/21/2014  5:25 AM      Result Value Ref Range   D-Dimer, Quant 2.29 (*) 0.00 - 0.48 ug/mL-FEU   Comment:            AT THE INHOUSE ESTABLISHED CUTOFF     VALUE OF 0.48 ug/mL FEU,     THIS ASSAY HAS BEEN DOCUMENTED     IN THE LITERATURE TO HAVE     A SENSITIVITY AND NEGATIVE     PREDICTIVE VALUE OF AT LEAST     98 TO 99%.  THE TEST RESULT     SHOULD BE CORRELATED WITH     AN ASSESSMENT OF THE CLINICAL     PROBABILITY OF DVT / VTE.    Ebony Peterson is a 80 y.o. female with past medical history of asthma, hypertension, remote history of breast cancer was seen in the ER yesterday with transient chest pain and subsequently discharged home she presented back early this morning with increasing chest pain and shortness of breath.  On further evaluation and found to have a widened mediastinum on chest x-ray which prompted a CT chest which revealed a ruptured ascending aortic aneurysm with mediastinal hematoma.  After discussion with thoracic surgery, EDP and patient's family, it was relayed to the patient's family that she would not be a surgical candidate and would not  survive any surgical intervention and hence she is being admitted for comfort measures.  Currently drowsy after receiving IV narcotics and unable to provide any further history  Hospital course Patient was found to have a diffuse periosteal hematoma and hemopericardium, considered not to be a surgical candidate per cardiovascular thoracic surgery, Dr. Roxy Manns Admitted for comfort care measures Patient passed away at 9:40 AM on 08-14-2023    Blood pressure 151/72, pulse 109, temperature 99.8 F (37.7 C), temperature source Oral, resp. rate 20, SpO2 100.00%.         SignedReyne Dumas 08-13-14, 12:54 PM

## 2014-08-16 NOTE — Progress Notes (Signed)
Patient is a DNR, was unresponsive when I was called by her daughter who was at bedside at this time, last breath was witnessed, no pulse, pooling of blood in feet and hands, Dr. Allyson Sabal was notified at time of death which was 2. [place] [facility] was also notified, patient is not a candidate for donor,  family asked to take all her personal belongings if there is any.

## 2014-08-16 DEATH — deceased

## 2014-09-03 IMAGING — CR DG CHEST 2V
2 series · 2 of 2 positions shown · non-contrast
Comparison: 09/01/2013

CLINICAL DATA: Fall, COPD

EXAM:
CHEST  2 VIEW

[x chest ap]
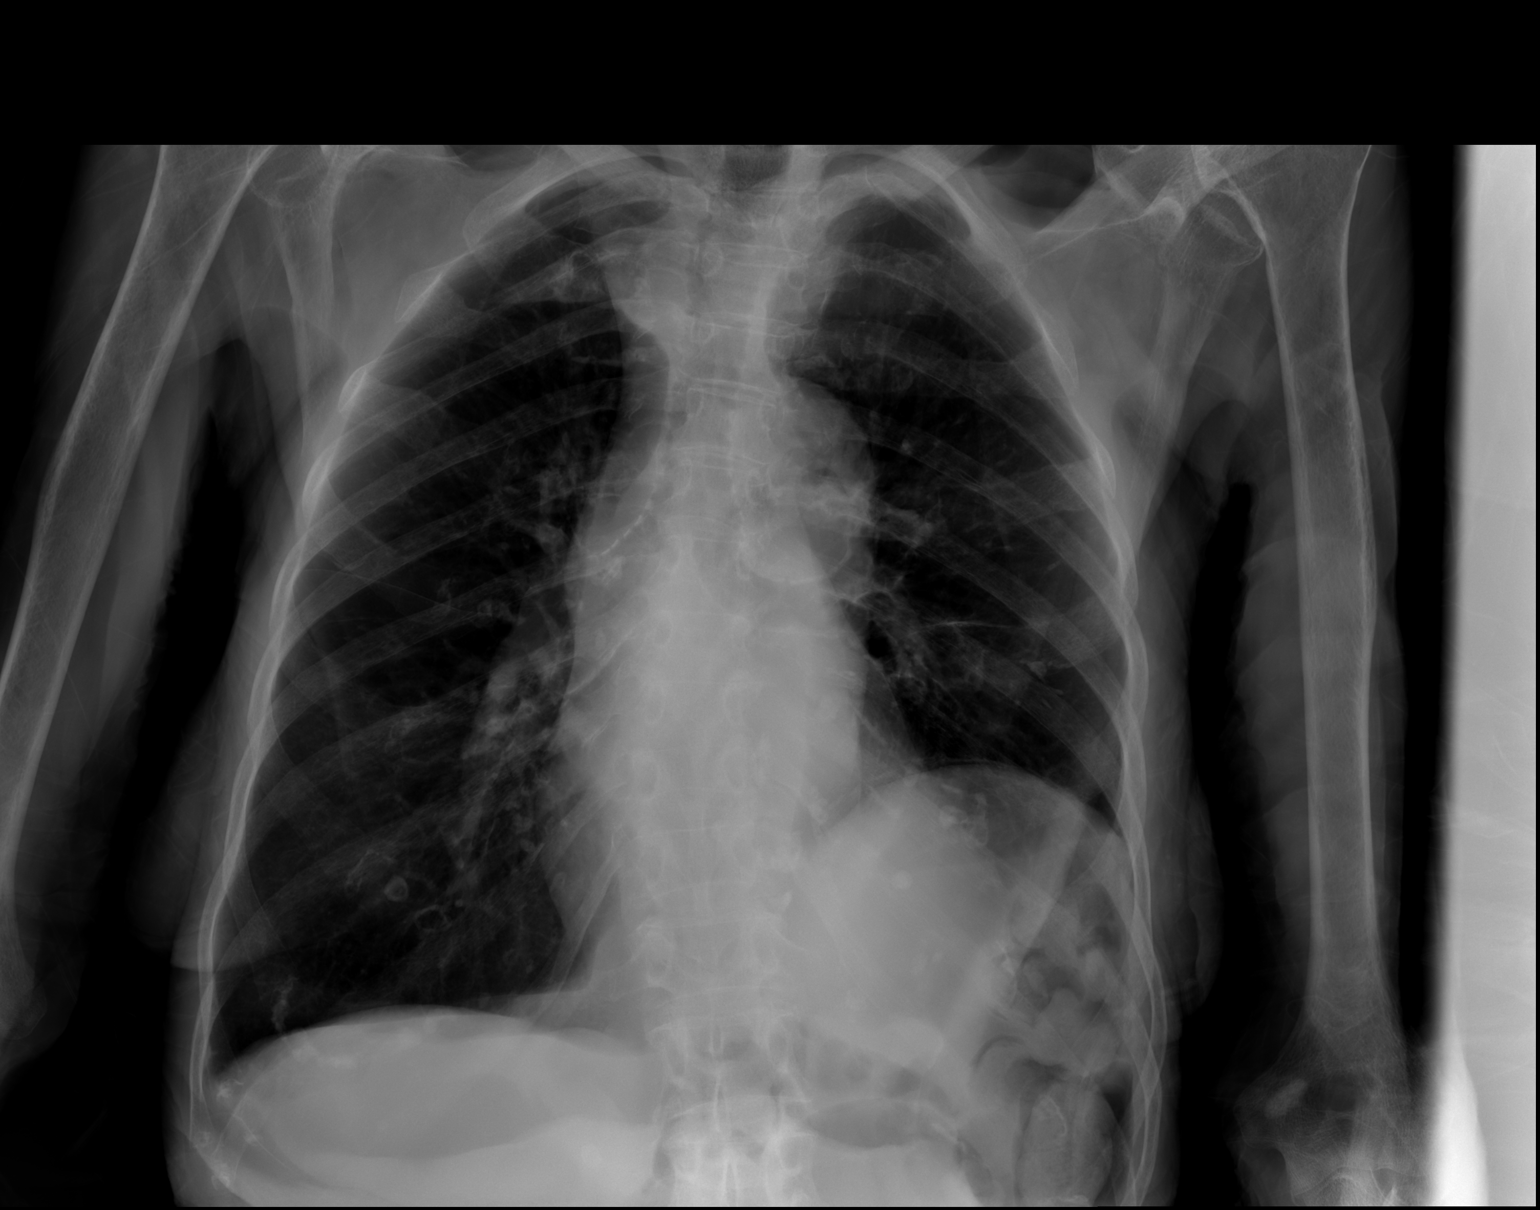

[w chest lat]
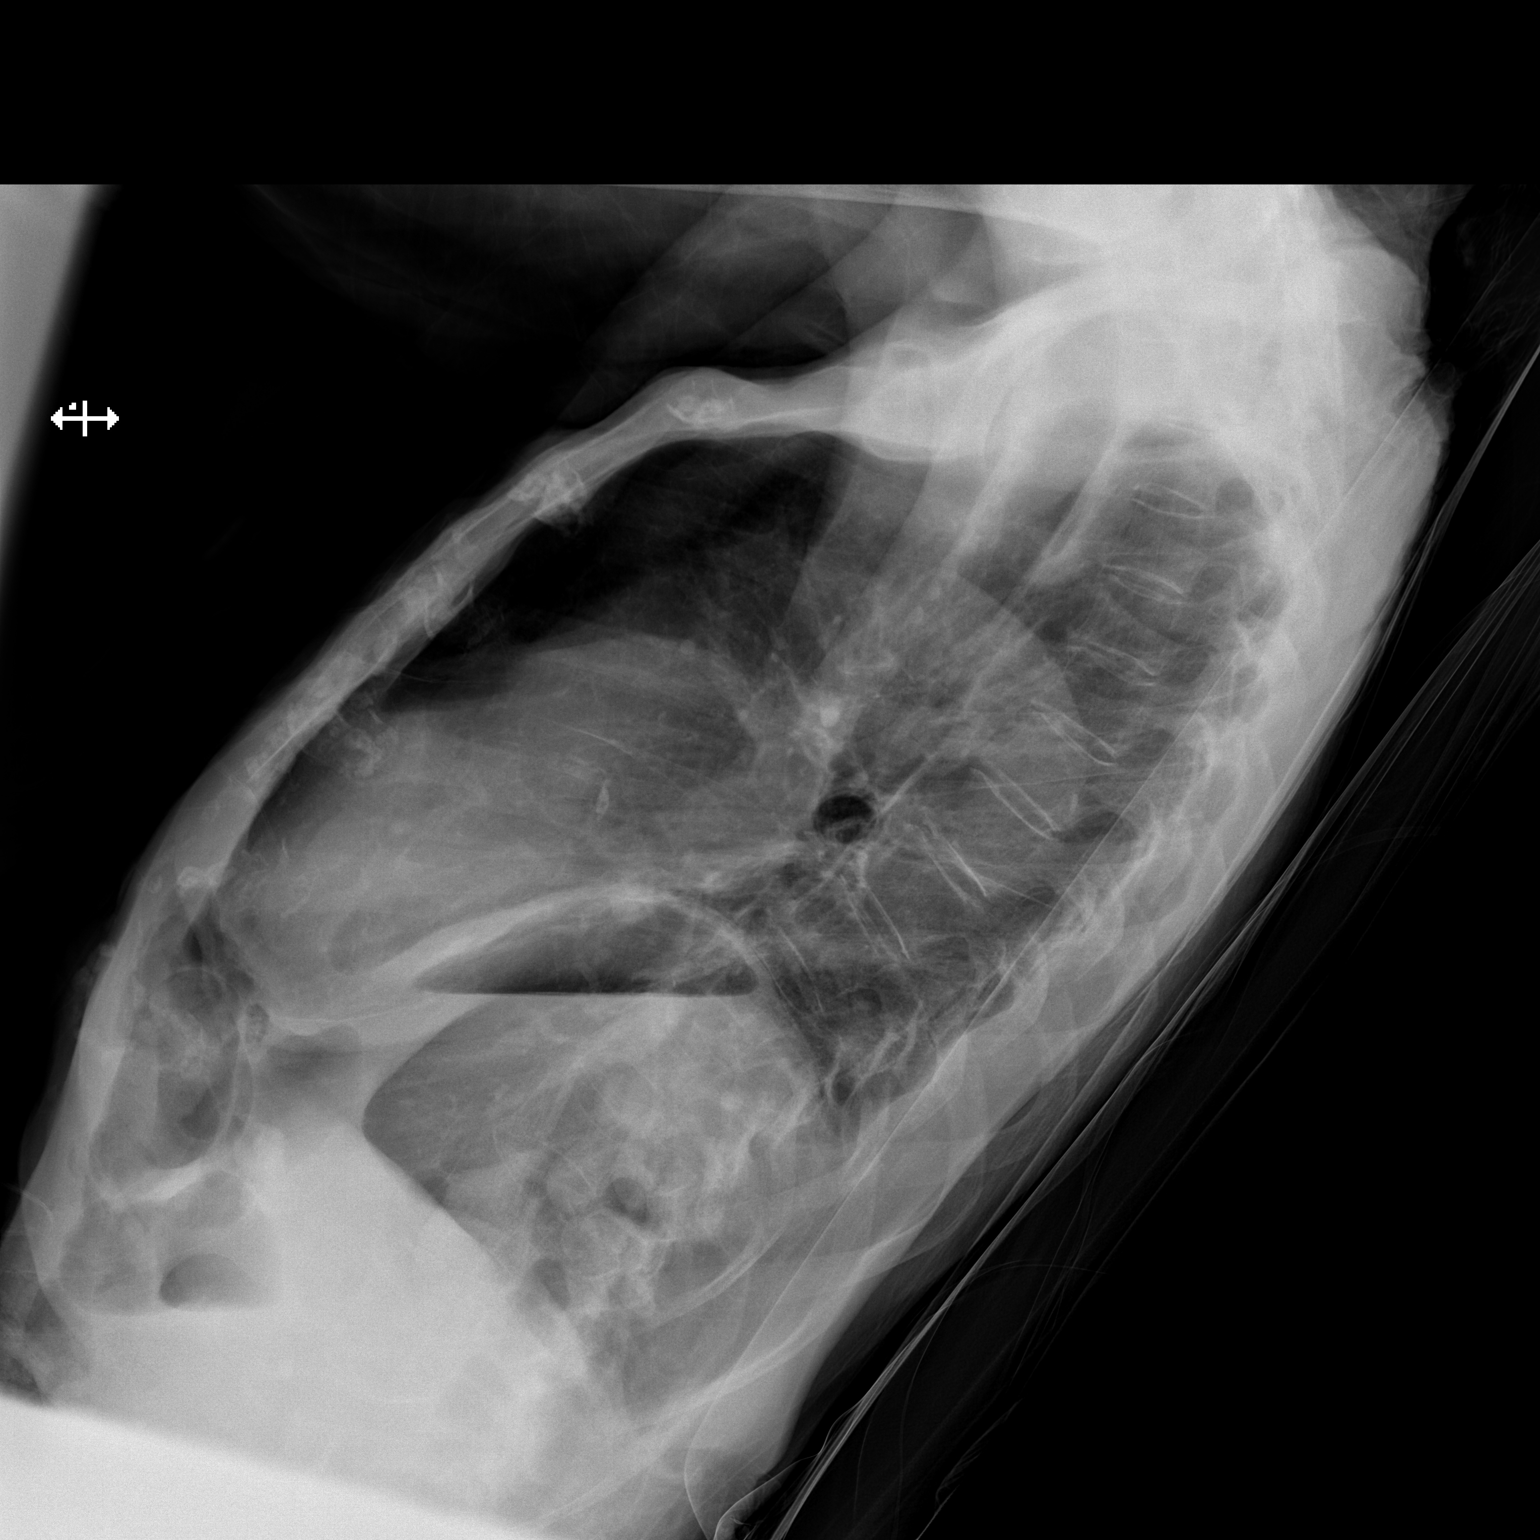

[2 of 2 positions shown; findings below may reference images not displayed]

FINDINGS: Hyperinflation with chronic significant left diaphragm elevation.
Heart size and vascular pattern normal. Lungs clear. Bony thorax
shows no acute findings.
IMPRESSION: COPD with no acute abnormality.
